# Patient Record
Sex: Male | Born: 1990 | Race: White | Hispanic: No | Marital: Single | Smoking: Former smoker
Health system: Southern US, Community
[De-identification: ages and names within clinical notes are randomized; demographics above are authoritative.]

## PROBLEM LIST (undated history)

## (undated) DIAGNOSIS — E781 Pure hyperglyceridemia: Secondary | ICD-10-CM

## (undated) DIAGNOSIS — K859 Acute pancreatitis without necrosis or infection, unspecified: Secondary | ICD-10-CM

## (undated) DIAGNOSIS — I8500 Esophageal varices without bleeding: Secondary | ICD-10-CM

## (undated) DIAGNOSIS — G4700 Insomnia, unspecified: Secondary | ICD-10-CM

## (undated) DIAGNOSIS — R569 Unspecified convulsions: Secondary | ICD-10-CM

## (undated) DIAGNOSIS — I10 Essential (primary) hypertension: Secondary | ICD-10-CM

## (undated) DIAGNOSIS — F32A Depression, unspecified: Secondary | ICD-10-CM

## (undated) DIAGNOSIS — F419 Anxiety disorder, unspecified: Secondary | ICD-10-CM

## (undated) HISTORY — DX: Essential (primary) hypertension: I10

## (undated) HISTORY — DX: Esophageal varices without bleeding: I85.00

## (undated) HISTORY — DX: Insomnia, unspecified: G47.00

## (undated) HISTORY — DX: Acute pancreatitis without necrosis or infection, unspecified: K85.90

## (undated) HISTORY — DX: Anxiety disorder, unspecified: F41.9

## (undated) HISTORY — DX: Pure hyperglyceridemia: E78.1

## (undated) HISTORY — DX: Depression, unspecified: F32.A

## (undated) HISTORY — DX: Unspecified convulsions: R56.9

---

## 2020-09-29 ENCOUNTER — Inpatient Hospital Stay (HOSPITAL_COMMUNITY)
Admission: AD | Admit: 2020-09-29 | Discharge: 2020-10-14 | DRG: 438 | Disposition: A | Discharge status: Home / Self Care | Payer: Medicaid Other | Source: Other Acute Inpatient Hospital | Attending: Internal Medicine | Admitting: Internal Medicine

## 2020-09-29 DIAGNOSIS — D72829 Elevated white blood cell count, unspecified: Secondary | ICD-10-CM | POA: Diagnosis present

## 2020-09-29 DIAGNOSIS — D696 Thrombocytopenia, unspecified: Secondary | ICD-10-CM | POA: Diagnosis not present

## 2020-09-29 DIAGNOSIS — F1010 Alcohol abuse, uncomplicated: Secondary | ICD-10-CM | POA: Diagnosis present

## 2020-09-29 DIAGNOSIS — E872 Acidosis: Secondary | ICD-10-CM | POA: Diagnosis not present

## 2020-09-29 DIAGNOSIS — D62 Acute posthemorrhagic anemia: Secondary | ICD-10-CM

## 2020-09-29 DIAGNOSIS — N179 Acute kidney failure, unspecified: Secondary | ICD-10-CM | POA: Diagnosis present

## 2020-09-29 DIAGNOSIS — K859 Acute pancreatitis without necrosis or infection, unspecified: Principal | ICD-10-CM | POA: Diagnosis present

## 2020-09-29 DIAGNOSIS — D6489 Other specified anemias: Secondary | ICD-10-CM | POA: Diagnosis not present

## 2020-09-29 DIAGNOSIS — K863 Pseudocyst of pancreas: Secondary | ICD-10-CM | POA: Diagnosis present

## 2020-09-29 DIAGNOSIS — R509 Fever, unspecified: Secondary | ICD-10-CM

## 2020-09-29 DIAGNOSIS — K852 Alcohol induced acute pancreatitis without necrosis or infection: Principal | ICD-10-CM | POA: Diagnosis present

## 2020-09-29 DIAGNOSIS — K267 Chronic duodenal ulcer without hemorrhage or perforation: Secondary | ICD-10-CM | POA: Diagnosis present

## 2020-09-29 DIAGNOSIS — K921 Melena: Secondary | ICD-10-CM | POA: Diagnosis not present

## 2020-09-29 DIAGNOSIS — R935 Abnormal findings on diagnostic imaging of other abdominal regions, including retroperitoneum: Secondary | ICD-10-CM

## 2020-09-29 DIAGNOSIS — E871 Hypo-osmolality and hyponatremia: Secondary | ICD-10-CM | POA: Diagnosis present

## 2020-09-29 DIAGNOSIS — A419 Sepsis, unspecified organism: Secondary | ICD-10-CM | POA: Diagnosis not present

## 2020-09-29 DIAGNOSIS — I8500 Esophageal varices without bleeding: Secondary | ICD-10-CM

## 2020-09-29 DIAGNOSIS — Z20822 Contact with and (suspected) exposure to covid-19: Secondary | ICD-10-CM | POA: Diagnosis present

## 2020-09-29 DIAGNOSIS — K862 Cyst of pancreas: Secondary | ICD-10-CM | POA: Diagnosis present

## 2020-09-29 DIAGNOSIS — K766 Portal hypertension: Secondary | ICD-10-CM | POA: Diagnosis present

## 2020-09-29 DIAGNOSIS — I10 Essential (primary) hypertension: Secondary | ICD-10-CM | POA: Diagnosis not present

## 2020-09-29 DIAGNOSIS — K8500 Idiopathic acute pancreatitis without necrosis or infection: Secondary | ICD-10-CM | POA: Diagnosis not present

## 2020-09-29 DIAGNOSIS — I471 Supraventricular tachycardia: Secondary | ICD-10-CM | POA: Diagnosis not present

## 2020-09-29 DIAGNOSIS — I8510 Secondary esophageal varices without bleeding: Secondary | ICD-10-CM | POA: Diagnosis not present

## 2020-09-29 DIAGNOSIS — J189 Pneumonia, unspecified organism: Secondary | ICD-10-CM | POA: Diagnosis not present

## 2020-09-29 DIAGNOSIS — R0682 Tachypnea, not elsewhere classified: Secondary | ICD-10-CM

## 2020-09-29 DIAGNOSIS — E861 Hypovolemia: Secondary | ICD-10-CM | POA: Diagnosis present

## 2020-09-29 DIAGNOSIS — E781 Pure hyperglyceridemia: Secondary | ICD-10-CM | POA: Diagnosis present

## 2020-09-29 DIAGNOSIS — J918 Pleural effusion in other conditions classified elsewhere: Secondary | ICD-10-CM | POA: Diagnosis not present

## 2020-09-29 DIAGNOSIS — E162 Hypoglycemia, unspecified: Secondary | ICD-10-CM | POA: Diagnosis not present

## 2020-09-29 DIAGNOSIS — K3189 Other diseases of stomach and duodenum: Secondary | ICD-10-CM | POA: Diagnosis present

## 2020-09-29 DIAGNOSIS — D735 Infarction of spleen: Secondary | ICD-10-CM | POA: Diagnosis not present

## 2020-09-29 DIAGNOSIS — R0600 Dyspnea, unspecified: Secondary | ICD-10-CM

## 2020-09-29 DIAGNOSIS — E8809 Other disorders of plasma-protein metabolism, not elsewhere classified: Secondary | ICD-10-CM | POA: Diagnosis not present

## 2020-09-29 DIAGNOSIS — J90 Pleural effusion, not elsewhere classified: Secondary | ICD-10-CM

## 2020-09-29 LAB — GLUCOSE, CAPILLARY: Glucose-Capillary: 149 mg/dL — ABNORMAL HIGH (ref 70–99)

## 2020-09-29 MED ORDER — CHLORHEXIDINE GLUCONATE CLOTH 2 % EX PADS
6.0000 | MEDICATED_PAD | Freq: Every day | CUTANEOUS | Status: DC
  Administered 2020-09-29 – 2020-10-12 (×7): 6 via TOPICAL

## 2020-09-30 ENCOUNTER — Other Ambulatory Visit: Payer: Self-pay

## 2020-09-30 ENCOUNTER — Encounter (HOSPITAL_COMMUNITY): Payer: Self-pay | Admitting: Pulmonary Disease

## 2020-09-30 ENCOUNTER — Inpatient Hospital Stay (HOSPITAL_COMMUNITY): Payer: Medicaid Other

## 2020-09-30 DIAGNOSIS — K852 Alcohol induced acute pancreatitis without necrosis or infection: Principal | ICD-10-CM

## 2020-09-30 DIAGNOSIS — K859 Acute pancreatitis without necrosis or infection, unspecified: Secondary | ICD-10-CM | POA: Diagnosis present

## 2020-09-30 DIAGNOSIS — E781 Pure hyperglyceridemia: Secondary | ICD-10-CM | POA: Diagnosis present

## 2020-09-30 DIAGNOSIS — E872 Acidosis: Secondary | ICD-10-CM

## 2020-09-30 DIAGNOSIS — N179 Acute kidney failure, unspecified: Secondary | ICD-10-CM | POA: Diagnosis present

## 2020-09-30 DIAGNOSIS — D72829 Elevated white blood cell count, unspecified: Secondary | ICD-10-CM | POA: Diagnosis present

## 2020-09-30 LAB — CBC WITH DIFFERENTIAL/PLATELET
Abs Immature Granulocytes: 0.11 10*3/uL — ABNORMAL HIGH (ref 0.00–0.07)
Basophils Absolute: 0.1 10*3/uL (ref 0.0–0.1)
Basophils Relative: 0 %
Eosinophils Absolute: 0 10*3/uL (ref 0.0–0.5)
Eosinophils Relative: 0 %
HCT: 44.8 % (ref 39.0–52.0)
Hemoglobin: 15.7 g/dL (ref 13.0–17.0)
Immature Granulocytes: 1 %
Lymphocytes Relative: 8 %
Lymphs Abs: 1.3 10*3/uL (ref 0.7–4.0)
MCH: 33 pg (ref 26.0–34.0)
MCHC: 35 g/dL (ref 30.0–36.0)
MCV: 94.1 fL (ref 80.0–100.0)
Monocytes Absolute: 0.2 10*3/uL (ref 0.1–1.0)
Monocytes Relative: 1 %
Neutro Abs: 14 10*3/uL — ABNORMAL HIGH (ref 1.7–7.7)
Neutrophils Relative %: 90 %
Platelets: 221 10*3/uL (ref 150–400)
RBC: 4.76 MIL/uL (ref 4.22–5.81)
RDW: 14.6 % (ref 11.5–15.5)
WBC: 15.4 10*3/uL — ABNORMAL HIGH (ref 4.0–10.5)
nRBC: 0 % (ref 0.0–0.2)

## 2020-09-30 LAB — COMPREHENSIVE METABOLIC PANEL
ALT: 20 U/L (ref 0–44)
ALT: 8 U/L (ref 0–44)
AST: 49 U/L — ABNORMAL HIGH (ref 15–41)
AST: 48 U/L — ABNORMAL HIGH (ref 15–41)
Albumin: 2.8 g/dL — ABNORMAL LOW (ref 3.5–5.0)
Albumin: 2.6 g/dL — ABNORMAL LOW (ref 3.5–5.0)
Alkaline Phosphatase: 44 U/L (ref 38–126)
Alkaline Phosphatase: 45 U/L (ref 38–126)
Anion gap: 10 (ref 5–15)
Anion gap: 9 (ref 5–15)
BUN: 26 mg/dL — ABNORMAL HIGH (ref 6–20)
BUN: 26 mg/dL — ABNORMAL HIGH (ref 6–20)
CO2: 18 mmol/L — ABNORMAL LOW (ref 22–32)
CO2: 19 mmol/L — ABNORMAL LOW (ref 22–32)
Calcium: 6.3 mg/dL — CL (ref 8.9–10.3)
Calcium: 6.6 mg/dL — ABNORMAL LOW (ref 8.9–10.3)
Chloride: 101 mmol/L (ref 98–111)
Chloride: 102 mmol/L (ref 98–111)
Creatinine, Ser: 1.39 mg/dL — ABNORMAL HIGH (ref 0.61–1.24)
Creatinine, Ser: 1.37 mg/dL — ABNORMAL HIGH (ref 0.61–1.24)
GFR, Estimated: >60 mL/min (ref >60)
GFR, Estimated: >60 mL/min (ref >60)
Glucose, Bld: 114 mg/dL — ABNORMAL HIGH (ref 70–99)
Glucose, Bld: 110 mg/dL — ABNORMAL HIGH (ref 70–99)
Potassium: 4.8 mmol/L (ref 3.5–5.1)
Potassium: 4.7 mmol/L (ref 3.5–5.1)
Sodium: 129 mmol/L — ABNORMAL LOW (ref 135–145)
Sodium: 130 mmol/L — ABNORMAL LOW (ref 135–145)
Total Bilirubin: 1.5 mg/dL — ABNORMAL HIGH (ref 0.3–1.2)
Total Bilirubin: 1.7 mg/dL — ABNORMAL HIGH (ref 0.3–1.2)
Total Protein: 5.9 g/dL — ABNORMAL LOW (ref 6.5–8.1)
Total Protein: 5.6 g/dL — ABNORMAL LOW (ref 6.5–8.1)

## 2020-09-30 LAB — GLUCOSE, CAPILLARY
Glucose-Capillary: 105 mg/dL — ABNORMAL HIGH (ref 70–99)
Glucose-Capillary: 122 mg/dL — ABNORMAL HIGH (ref 70–99)
Glucose-Capillary: 127 mg/dL — ABNORMAL HIGH (ref 70–99)
Glucose-Capillary: 132 mg/dL — ABNORMAL HIGH (ref 70–99)
Glucose-Capillary: 137 mg/dL — ABNORMAL HIGH (ref 70–99)
Glucose-Capillary: 138 mg/dL — ABNORMAL HIGH (ref 70–99)
Glucose-Capillary: 143 mg/dL — ABNORMAL HIGH (ref 70–99)
Glucose-Capillary: 153 mg/dL — ABNORMAL HIGH (ref 70–99)
Glucose-Capillary: 162 mg/dL — ABNORMAL HIGH (ref 70–99)
Glucose-Capillary: 176 mg/dL — ABNORMAL HIGH (ref 70–99)
Glucose-Capillary: 56 mg/dL — ABNORMAL LOW (ref 70–99)
Glucose-Capillary: 59 mg/dL — ABNORMAL LOW (ref 70–99)
Glucose-Capillary: 75 mg/dL (ref 70–99)
Glucose-Capillary: 84 mg/dL (ref 70–99)
Glucose-Capillary: 98 mg/dL (ref 70–99)

## 2020-09-30 LAB — MRSA PCR SCREENING: MRSA by PCR: NEGATIVE

## 2020-09-30 LAB — POCT I-STAT 7, (LYTES, BLD GAS, ICA,H+H)
Acid-base deficit: 4 mmol/L — ABNORMAL HIGH (ref 0.0–2.0)
Bicarbonate: 19.2 mmol/L — ABNORMAL LOW (ref 20.0–28.0)
Calcium, Ion: 0.91 mmol/L — ABNORMAL LOW (ref 1.15–1.40)
HCT: 46 % (ref 39.0–52.0)
Hemoglobin: 15.6 g/dL (ref 13.0–17.0)
O2 Saturation: 96 %
Patient temperature: 99
Potassium: 4.2 mmol/L (ref 3.5–5.1)
Sodium: 132 mmol/L — ABNORMAL LOW (ref 135–145)
TCO2: 20 mmol/L — ABNORMAL LOW (ref 22–32)
pCO2 arterial: 31.6 mmHg — ABNORMAL LOW (ref 32.0–48.0)
pH, Arterial: 7.394 (ref 7.350–7.450)
pO2, Arterial: 81 mmHg — ABNORMAL LOW (ref 83.0–108.0)

## 2020-09-30 LAB — HIV ANTIBODY (ROUTINE TESTING W REFLEX): HIV Screen 4th Generation wRfx: NONREACTIVE

## 2020-09-30 LAB — CBC
HCT: 42.7 % (ref 39.0–52.0)
Hemoglobin: 14.7 g/dL (ref 13.0–17.0)
MCH: 32.9 pg (ref 26.0–34.0)
MCHC: 34.4 g/dL (ref 30.0–36.0)
MCV: 95.5 fL (ref 80.0–100.0)
Platelets: 194 10*3/uL (ref 150–400)
RBC: 4.47 MIL/uL (ref 4.22–5.81)
RDW: 14.9 % (ref 11.5–15.5)
WBC: 14.6 10*3/uL — ABNORMAL HIGH (ref 4.0–10.5)
nRBC: 0 % (ref 0.0–0.2)

## 2020-09-30 LAB — TRIGLYCERIDES
Triglycerides: 1014 mg/dL — ABNORMAL HIGH (ref <150)
Triglycerides: 760 mg/dL — ABNORMAL HIGH (ref <150)
Triglycerides: 568 mg/dL — ABNORMAL HIGH (ref <150)

## 2020-09-30 LAB — PHOSPHORUS: Phosphorus: 2.6 mg/dL (ref 2.5–4.6)

## 2020-09-30 LAB — PROTIME-INR
INR: 1.2 (ref 0.8–1.2)
Prothrombin Time: 15.6 seconds — ABNORMAL HIGH (ref 11.4–15.2)

## 2020-09-30 LAB — MAGNESIUM: Magnesium: 2.1 mg/dL (ref 1.7–2.4)

## 2020-09-30 LAB — LIPASE, BLOOD: Lipase: 180 U/L — ABNORMAL HIGH (ref 11–51)

## 2020-09-30 MED ORDER — HEPARIN SODIUM (PORCINE) 5000 UNIT/ML IJ SOLN
5000.0000 [IU] | Freq: Three times a day (TID) | INTRAMUSCULAR | Status: DC
  Administered 2020-09-30 – 2020-10-08 (×27): 5000 [IU] via SUBCUTANEOUS
  Filled 2020-09-30 (×25): qty 1

## 2020-09-30 MED ORDER — SODIUM CHLORIDE 0.9 % IV SOLN
8.0000 mg | Freq: Four times a day (QID) | INTRAVENOUS | Status: DC | PRN
  Administered 2020-09-30 (×2): 8 mg via INTRAVENOUS
  Filled 2020-09-30 (×6): qty 4

## 2020-09-30 MED ORDER — POLYETHYLENE GLYCOL 3350 17 G PO PACK: 17.0000 g | PACK | Freq: Every day | ORAL | Status: DC | PRN

## 2020-09-30 MED ORDER — OXYCODONE HCL 5 MG PO TABS
5.0000 mg | ORAL_TABLET | Freq: Four times a day (QID) | ORAL | Status: DC | PRN
  Administered 2020-09-30 – 2020-10-05 (×11): 10 mg via ORAL
  Filled 2020-09-30 (×11): qty 2

## 2020-09-30 MED ORDER — INSULIN (MYXREDLIN) INFUSION FOR HYPERTRIGLYCERIDEMIA
0.0500 [IU]/kg/h | INTRAVENOUS | Status: DC
  Administered 2020-09-30 (×2): 0.1 [IU]/kg/h via INTRAVENOUS
  Filled 2020-09-30 (×2): qty 100

## 2020-09-30 MED ORDER — DEXTROSE-NACL 10-0.45 % IV SOLN
INTRAVENOUS | Status: DC
  Filled 2020-09-30 (×13): qty 1000

## 2020-09-30 MED ORDER — MORPHINE SULFATE (PF) 2 MG/ML IV SOLN
2.0000 mg | INTRAVENOUS | Status: DC | PRN
  Administered 2020-09-30: 4 mg via INTRAVENOUS
  Administered 2020-09-30 (×4): 2 mg via INTRAVENOUS
  Administered 2020-10-01: 4 mg via INTRAVENOUS
  Administered 2020-10-01: 2 mg via INTRAVENOUS
  Filled 2020-09-30 (×4): qty 2
  Filled 2020-09-30: qty 1
  Filled 2020-09-30: qty 2

## 2020-09-30 MED ORDER — DOCUSATE SODIUM 100 MG PO CAPS: 100.0000 mg | ORAL_CAPSULE | Freq: Two times a day (BID) | ORAL | Status: DC | PRN

## 2020-09-30 MED ORDER — LORAZEPAM 2 MG/ML IJ SOLN: 1.0000 mg | INTRAMUSCULAR | Status: DC | PRN

## 2020-09-30 MED ORDER — CALCIUM GLUCONATE-NACL 1-0.675 GM/50ML-% IV SOLN
1.0000 g | Freq: Once | INTRAVENOUS | Status: AC
  Administered 2020-09-30: 1000 mg via INTRAVENOUS
  Filled 2020-09-30: qty 50

## 2020-09-30 MED ORDER — THIAMINE HCL 100 MG PO TABS: 100.0000 mg | ORAL_TABLET | Freq: Every day | ORAL | Status: DC

## 2020-09-30 MED ORDER — INSULIN (MYXREDLIN) INFUSION FOR HYPERTRIGLYCERIDEMIA: 1.0000 [IU]/h | INTRAVENOUS | Status: DC

## 2020-09-30 MED ORDER — THIAMINE HCL 100 MG/ML IJ SOLN
100.0000 mg | Freq: Every day | INTRAMUSCULAR | Status: DC
  Administered 2020-09-30: 100 mg via INTRAVENOUS
  Filled 2020-09-30: qty 2

## 2020-09-30 MED ORDER — LACTATED RINGERS IV SOLN: INTRAVENOUS | Status: DC

## 2020-09-30 MED ORDER — INSULIN (MYXREDLIN) INFUSION FOR HYPERTRIGLYCERIDEMIA
0.2000 [IU]/kg/h | INTRAVENOUS | Status: DC
  Administered 2020-09-30: 0.2 [IU]/kg/h via INTRAVENOUS
  Filled 2020-09-30: qty 100

## 2020-09-30 MED ORDER — SODIUM CHLORIDE 0.9 % IV SOLN: INTRAVENOUS | Status: DC | PRN

## 2020-09-30 MED ORDER — ACETAMINOPHEN 325 MG PO TABS
650.0000 mg | ORAL_TABLET | ORAL | Status: AC | PRN
  Administered 2020-10-03 (×2): 650 mg via ORAL
  Filled 2020-09-30 (×2): qty 2

## 2020-09-30 MED ORDER — FENOFIBRATE 160 MG PO TABS
160.0000 mg | ORAL_TABLET | Freq: Every day | ORAL | Status: DC
  Administered 2020-09-30: 160 mg via ORAL
  Filled 2020-09-30 (×2): qty 1

## 2020-09-30 NOTE — H&P (Signed)
NAME:  Dennis [name], MRN:  [id number], DOB:  17-Jul-1990, LOS: 1 ADMISSION DATE:  09/29/2020, CONSULTATION DATE:  09/29/2020 REFERRING MD:  [facility], CHIEF COMPLAINT:  Pancreatitis, hypertriglyceridemia   History of Present Illness:  30 year old male with PMHx significant for EtOH use, tobacco use and marijuana use with remote seizures (none since several years ago). Presented to [facility] 5/1 with severe mid-epigastric abdominal pain cx/f pancreatitis.  Patient was previously evaluated at [facility] 2 weeks prior with similar symptoms and diagnosed with alcoholic pancreatitis. At that time he was discharged home with conservative management/analgesics and he ceased EtOH use (has not drank since discharge). Patient re-presented after recurrence of epigastric pain. Labs in [place] ED demonstrated WBC 23.5, stable H&H/Plt, Na 131, K 4.8, Bicarb 18, Cr 0.9, Glucose 131. Lipase was noted to be 555 and triglycerides 2423. CT A/P with contrast demonstrated acute pancreatitis with increased inflammatory changes compared to prior CT as well as increased fluid around the body of the pancreas. He was admitted with plan for aggressive fluid resuscitation, but the next day was found to be tachycardic with AKI (Cr 2.3 from baseline 0.9).  Patient was subsequently started on an insulin gtt and transferred to [facility] for management of acute pancreatitis with consideration of PLEX for hypertriglyceridemia.   Pertinent Medical History:  Tobacco use EtOH use Marijuana use  Significant Hospital Events: Including procedures, antibiotic start and stop dates in addition to other pertinent events   . 5/2 - Transferred from [facility] for pancreatitis, hypertriglyceridemia with possible need for PLEX; insulin gtt initiated  Interim History / Subjective:    Objective   Blood pressure (!) 150/105, pulse (!) 122, temperature 99 F (37.2 C), temperature source Oral, resp. rate (!) 25, height 6'  1" (1.854 m), weight 103 kg, SpO2 96 %.       No intake or output data in the 24 hours ending 09/30/20 0037 Filed Weights   09/29/20 2309  Weight: 103 kg   Physical Examination: General: Acutely ill-appearing man in NAD, appears uncomfortable. HEENT: [place]/AT, anicteric sclera, PERRL, moist mucous membranes. Neuro: Awake, oriented x 4. Responds to verbal stimuli. Following commands consistently. Moves all 4 extremities spontaneously. Strength 5/5 in all 4 extremities. CV: Tachycardic, regular rhythm, no m/g/r. PULM: Tachypneic to 30s on RA. Lung fields diminished bilaterally, ?splinting 2/2 pain. GI: Moderately distended, tender to palpation over epigastrum and at lateral aspects of abdomen. Hypoactive bowel sounds. Extremities: Trace BLE edema noted. Skin: Warm/dry, no rashes.  Labs/imaging that I have personally reviewed:  (right click and "Reselect all SmartList Selections" daily)  WBC 23.5, stable H&H/Plt  Na 131, K 4.8, Bicarb 18, Cr 0.9, Glucose 131  Lipase 555, triglycerides 2423  CT A/P with contrast demonstrated acute pancreatitis with increased inflammatory changes compared to prior CT as well as increased fluid around the body of the pancreas.  Resolved Hospital Problem list     Assessment & Plan:  Acute pancreatitis secondary to alcohol use Hypertriglyceridemia Presented to [facility] with severe epigastric abdominal pain. Similar presentation 2 weeks prior to admission. C/f alcoholic pancreatitis. CT A/P with findings consistent with acute pancreatitis. US Gallbladder/RUQ was negative for gallstones/biliary dilatation. Lipase 555, Triglycerides 2423. Transferred from [facility] for consideration of PLEX for triglyceride management in the setting of acute pancreatitis. - Insulin gtt until TG < 500 - NPO status, continue D10 + LR - Fluid resuscitation - Morphine PRN for pain - Trend pancreatic enzymes - Trend triglycerides - May need PLEX if  not improving,  would require central access  Leukocytosis Initial concern at OSH for intraabdominal infection; favor reactive given pancreatitis. Received Invanz x 1 at OSH. BCx NGTD. - Trend CBC, specifically WBC - F/u BCx - Obtain CXR/ABG  Acute kidney injury Hypomagnesemia Hypocalcemia - Trend BMP - Replete electrolytes as indicated - Monitor I&Os - F/u urine studies - Avoid nephrotoxic agents as able  EtOH use Tobacco use Marijuana use History of EtOH, tobacco and marijuana use. Reportedly abstinent from alcohol since pancreatitis diagnosis 2 weeks prior to admission - CIWA protocol - Consider nicotine patch as appropriate - Encourage cessation  Best practice (right click and "Reselect all SmartList Selections" daily)  Diet:  NPO Pain/Anxiety/Delirium protocol (if indicated): No VAP protocol (if indicated): Not indicated DVT prophylaxis: Subcutaneous Heparin GI prophylaxis: N/A Glucose control:  Insulin gtt Central venous access:  N/A Arterial line:  N/A Foley:  N/A Mobility:  OOB  PT consulted: Yes Last date of multidisciplinary goals of care discussion [5/2] Code Status:  full code Disposition: ICU  Labs   CBC: No results for input(s): WBC, NEUTROABS, HGB, HCT, MCV, PLT in the last 168 hours.  Basic Metabolic Panel: No results for input(s): NA, K, CL, CO2, GLUCOSE, BUN, CREATININE, CALCIUM, MG, PHOS in the last 168 hours. GFR: CrCl cannot be calculated (No successful lab value found.). No results for input(s): PROCALCITON, WBC, LATICACIDVEN in the last 168 hours.  Liver Function Tests: No results for input(s): AST, ALT, ALKPHOS, BILITOT, PROT, ALBUMIN in the last 168 hours. No results for input(s): LIPASE, AMYLASE in the last 168 hours. No results for input(s): AMMONIA in the last 168 hours.  ABG No results found for: PHART, PCO2ART, PO2ART, HCO3, TCO2, ACIDBASEDEF, O2SAT   Coagulation Profile: No results for input(s): INR, PROTIME in the last 168 hours.  Cardiac  Enzymes: No results for input(s): CKTOTAL, CKMB, CKMBINDEX, TROPONINI in the last 168 hours.  HbA1C: No results found for: HGBA1C  CBG: Recent Labs  Lab 09/29/20 2305  GLUCAP 149*   Review of Systems:   Review of systems completed with pertinent positives/negatives outlined in above HPI.  Past Medical History:  Tobacco use, EtOH use, marijuana use, ?seizures  Surgical History:  History reviewed. No pertinent surgical history.   Social History:      Family History:  His family history is not on file.   Allergies: Not on File   Home Medications  Prior to Admission medications   Not on File    Critical care time:    Faythe Ghee [facility] Pulmonary & Critical Care 09/30/20 12:37 AM  Please see [email or web address] for pager details.  From 7A-7P if no response, please call [phone] After hours, please call E-Link [phone]

## 2020-09-30 NOTE — Progress Notes (Signed)
eLink Physician-Brief Progress Note Patient Name: Dennis [name] DOB: [birth year] MRN: [id number]   Date of Service  09/30/2020  HPI/Events of Note  Patient needs PO PRN Tylenol order for analgesia.  eICU Interventions  Tylenol ordered.        Thomasene Lot Saulo Anthis 09/30/2020, 8:09 PM

## 2020-09-30 NOTE — Progress Notes (Signed)
NAME:  Dennis [name], MRN:  [id number], DOB:  [birth year], LOS: 1 ADMISSION DATE:  09/29/2020, CONSULTATION DATE:  09/29/2020 REFERRING MD:  [facility], CHIEF COMPLAINT:  Pancreatitis, hypertriglyceridemia   History of Present Illness:  30 year old male with PMHx significant for EtOH use, tobacco use and marijuana use with remote seizures (none since several years ago). Presented to [facility] 5/1 with severe mid-epigastric abdominal pain cx/f pancreatitis.  Patient was previously evaluated at [facility] 2 weeks prior with similar symptoms and diagnosed with alcoholic pancreatitis. At that time he was discharged home with conservative management/analgesics and he ceased EtOH use (has not drank since discharge). Patient re-presented after recurrence of epigastric pain. Labs in [place] ED demonstrated WBC 23.5, stable H&H/Plt, Na 131, K 4.8, Bicarb 18, Cr 0.9, Glucose 131. Lipase was noted to be 555 and triglycerides 2423. CT A/P with contrast demonstrated acute pancreatitis with increased inflammatory changes compared to prior CT as well as increased fluid around the body of the pancreas. He was admitted with plan for aggressive fluid resuscitation, but the next day was found to be tachycardic with AKI (Cr 2.3 from baseline 0.9).  Patient was subsequently started on an insulin gtt and transferred to [facility] for management of acute pancreatitis with consideration of PLEX for hypertriglyceridemia.   Pertinent Medical History:  Tobacco use EtOH use Marijuana use  Significant Hospital Events: Including procedures, antibiotic start and stop dates in addition to other pertinent events   . 5/2 - Transferred from [facility] for pancreatitis, hypertriglyceridemia with possible need for PLEX; insulin gtt initiated . 5/3 - TG down to 1014  Interim History / Subjective:  He has less abdominal pain. Thirsty, but not hungry.  Objective   Blood pressure (!) 144/107, pulse (!) 116,  temperature 97.7 F (36.5 C), temperature source Oral, resp. rate (!) 33, height 6\' 1"  (1.854 m), weight 103 kg, SpO2 94 %.        Intake/Output Summary (Last 24 hours) at 09/30/2020 11/30/2020 Last data filed at 09/30/2020 0600 Gross per 24 hour  Intake 1010.39 ml  Output 500 ml  Net 510.39 ml   Filed Weights   09/29/20 2309 09/30/20 0130  Weight: 103 kg 103 kg   Physical Examination: General: young man sitting up in bed in NAD HEENT: [place]/AT, eyes anicteric Neuro: awake, alert, moving all extremities, answering questions appropriately CV: tachycardic, reg rhythm PULM: CTAB, breathing comfortably GI: distended, soft, minimally tender Extremities: Trace BLE edema noted. Skin: Warm/dry, no rashes.  Labs/imaging that I have personally reviewed:  (right click and "Reselect all SmartList Selections" daily)   Na+ 130 BUN 26 Cr 1.37 AST 48 ALT 8 T. Bili 1.7 WBC 14.6  Resolved Hospital Problem list     Assessment & Plan:  Acute pancreatitis secondary to alcohol use & hypertriglyceridemia Presented to [facility] with severe epigastric abdominal pain. Similar presentation 2 weeks prior to admission. [facility] was negative for gallstones/biliary dilatation. Lipase 555, Triglycerides 2423.  - con't insulin infusion (fixed dose) until TG <500; rechecked this afternoon and again tomorrow morning  - clear liquid diet; monitor closely for nausea and increased pain - con't aggressive volume resuscitation - morphine + oxycodone PRN for pain - serial LFTs - responding appropriately to medical therapy; does not require plex -recommend strict ETOH avoidance long-term  Leukocytosis Initial concern at OSH for intraabdominal infection; favor reactive given pancreatitis. Received Invanz x 1 at OSH. BCx NGTD. -Agree with holding additional antibiotics - Follow blood cultures -Serial CBC to monitor  leukocytosis  Acute kidney injury Hypomagnesemia Hyponatremia Hypocalcemia Non-anion gap metabolic  acidosis -Serial BMPs -Electrolytes repleted -Strict I/os - Renally dose meds and avoid nephrotoxic meds  EtOH use Tobacco use Marijuana use History of EtOH, tobacco and marijuana use. Reportedly abstinent from alcohol since pancreatitis diagnosis 2 weeks prior to admission - CIWA protocol -Can add nicotine replacement therapy if needed - Encourage cessation  Mildly elevated transaminase and hyperbilirubinemia due to acute pancreatitis - Continue to monitor  Best practice (right click and "Reselect all SmartList Selections" daily)  Diet:  Oral- clears Pain/Anxiety/Delirium protocol (if indicated): No VAP protocol (if indicated): Not indicated DVT prophylaxis: Subcutaneous Heparin GI prophylaxis: N/A Glucose control:  Insulin gtt Central venous access:  N/A Arterial line:  N/A Foley:  N/A Mobility:  OOB  PT consulted: Yes Last date of multidisciplinary goals of care discussion [5/2] Code Status:  full code Disposition: ICU  Labs   CBC: Recent Labs  Lab 09/30/20 0113 09/30/20 0144  WBC 15.4*  --   NEUTROABS 14.0*  --   HGB 15.7 15.6  HCT 44.8 46.0  MCV 94.1  --   PLT 221  --     Basic Metabolic Panel: Recent Labs  Lab 09/30/20 0113 09/30/20 0144  NA 129* 132*  K 4.8 4.2  CL 101  --   CO2 18*  --   GLUCOSE 114*  --   BUN 26*  --   CREATININE 1.39*  --   CALCIUM 6.3*  --     This patient is critically ill with multiple organ system failure which requires frequent high complexity decision making, assessment, support, evaluation, and titration of therapies. This was completed through the application of advanced monitoring technologies and extensive interpretation of multiple databases. During this encounter critical care time was devoted to patient care services described in this note for 46 minutes.  Steffanie Dunn, DO 09/30/20 5:36 PM [facility]  For contact information, see Amion. If no response to pager, please call PCCM consult  pager. After hours, 7PM- 7AM, please call Elink.

## 2020-09-30 NOTE — Progress Notes (Signed)
eLink Physician-Brief Progress Note Patient Name: Hyde Sires DOB: 10-02-1990 MRN: [id number]   Date of Service  09/30/2020  HPI/Events of Note  Notified of hypoglycemia and insulin drip turned off  eICU Interventions  Increased D10 to 75 and decreased LR to 175 Repeating triglyceride this morning and assess if insulin may be discontinued     Intervention Category Intermediate Interventions: Other:  Darl Pikes 09/30/2020, 4:31 AM

## 2020-09-30 NOTE — Progress Notes (Signed)
eLink Physician-Brief Progress Note Patient Name: Dennis [name] DOB: 26-Jun-1990 MRN: [id number]   Date of Service  09/30/2020  HPI/Events of Note  3 M presented at [facility] ED with abdominal pain. Working diagnosis pancreatitis. Found to have triglyceride 2423, CO2 14, AG 18, BUN 25, Cr 1.5, Na 131, K 4.8, ALT 71, AST 36.  eICU Interventions  Continued on insulin drip unitl TG <500. Ongoing D10 and LR will continue Morphine prn for pain Repeat labs ordered     Intervention Category Major Interventions: Electrolyte abnormality - evaluation and management;Other: Evaluation Type: New Patient Evaluation  Darl Pikes 09/30/2020, 12:30 AM

## 2020-09-30 NOTE — Progress Notes (Signed)
eLink Physician-Brief Progress Note Patient Name: Dennis [name] DOB: 23-Jan-1991 MRN: [id number]   Date of Service  09/30/2020  HPI/Events of Note  Notified of calcium 6.3, albumin 2.8, corrected calcium 7.3 Ionized calcium 0.91  eICU Interventions  Calcium gluconate 1 g ordered     Intervention Category Major Interventions: Electrolyte abnormality - evaluation and management  Darl Pikes 09/30/2020, 3:39 AM

## 2020-10-01 DIAGNOSIS — F1010 Alcohol abuse, uncomplicated: Secondary | ICD-10-CM

## 2020-10-01 DIAGNOSIS — Z720 Tobacco use: Secondary | ICD-10-CM

## 2020-10-01 LAB — CBC
HCT: 36.1 % — ABNORMAL LOW (ref 39.0–52.0)
Hemoglobin: 12.1 g/dL — ABNORMAL LOW (ref 13.0–17.0)
MCH: 32 pg (ref 26.0–34.0)
MCHC: 33.5 g/dL (ref 30.0–36.0)
MCV: 95.5 fL (ref 80.0–100.0)
Platelets: 146 10*3/uL — ABNORMAL LOW (ref 150–400)
RBC: 3.78 MIL/uL — ABNORMAL LOW (ref 4.22–5.81)
RDW: 14.6 % (ref 11.5–15.5)
WBC: 9.6 10*3/uL (ref 4.0–10.5)
nRBC: 0 % (ref 0.0–0.2)

## 2020-10-01 LAB — HEPATIC FUNCTION PANEL
ALT: 15 U/L (ref 0–44)
AST: 28 U/L (ref 15–41)
Albumin: 2.4 g/dL — ABNORMAL LOW (ref 3.5–5.0)
Alkaline Phosphatase: 48 U/L (ref 38–126)
Bilirubin, Direct: 0.3 mg/dL — ABNORMAL HIGH (ref 0.0–0.2)
Indirect Bilirubin: 1.1 mg/dL — ABNORMAL HIGH (ref 0.3–0.9)
Total Bilirubin: 1.4 mg/dL — ABNORMAL HIGH (ref 0.3–1.2)
Total Protein: 5.4 g/dL — ABNORMAL LOW (ref 6.5–8.1)

## 2020-10-01 LAB — COMPREHENSIVE METABOLIC PANEL
ALT: 14 U/L (ref 0–44)
AST: 29 U/L (ref 15–41)
Albumin: 2.5 g/dL — ABNORMAL LOW (ref 3.5–5.0)
Alkaline Phosphatase: 45 U/L (ref 38–126)
Anion gap: 6 (ref 5–15)
BUN: 16 mg/dL (ref 6–20)
CO2: 23 mmol/L (ref 22–32)
Calcium: 6.7 mg/dL — ABNORMAL LOW (ref 8.9–10.3)
Chloride: 100 mmol/L (ref 98–111)
Creatinine, Ser: 1.01 mg/dL (ref 0.61–1.24)
GFR, Estimated: >60 mL/min (ref >60)
Glucose, Bld: 82 mg/dL (ref 70–99)
Potassium: 3.5 mmol/L (ref 3.5–5.1)
Sodium: 129 mmol/L — ABNORMAL LOW (ref 135–145)
Total Bilirubin: 1.4 mg/dL — ABNORMAL HIGH (ref 0.3–1.2)
Total Protein: 5.5 g/dL — ABNORMAL LOW (ref 6.5–8.1)

## 2020-10-01 LAB — GLUCOSE, CAPILLARY
Glucose-Capillary: 72 mg/dL (ref 70–99)
Glucose-Capillary: 77 mg/dL (ref 70–99)
Glucose-Capillary: 115 mg/dL — ABNORMAL HIGH (ref 70–99)
Glucose-Capillary: 135 mg/dL — ABNORMAL HIGH (ref 70–99)
Glucose-Capillary: 139 mg/dL — ABNORMAL HIGH (ref 70–99)
Glucose-Capillary: 134 mg/dL — ABNORMAL HIGH (ref 70–99)

## 2020-10-01 LAB — TRIGLYCERIDES
Triglycerides: 479 mg/dL — ABNORMAL HIGH (ref <150)
Triglycerides: 420 mg/dL — ABNORMAL HIGH (ref <150)
Triglycerides: 445 mg/dL — ABNORMAL HIGH (ref <150)

## 2020-10-01 MED ORDER — CALCIUM GLUCONATE-NACL 1-0.675 GM/50ML-% IV SOLN
1.0000 g | Freq: Once | INTRAVENOUS | Status: AC
  Administered 2020-10-01: 1000 mg via INTRAVENOUS
  Filled 2020-10-01: qty 50

## 2020-10-01 MED ORDER — THIAMINE HCL 100 MG/ML IJ SOLN
100.0000 mg | Freq: Every day | INTRAMUSCULAR | Status: DC
  Administered 2020-10-01 – 2020-10-06 (×6): 100 mg via INTRAVENOUS
  Filled 2020-10-01 (×6): qty 2

## 2020-10-01 MED ORDER — LABETALOL HCL 5 MG/ML IV SOLN
10.0000 mg | Freq: Four times a day (QID) | INTRAVENOUS | Status: DC | PRN
  Administered 2020-10-01 – 2020-10-02 (×2): 10 mg via INTRAVENOUS
  Filled 2020-10-01 (×2): qty 4

## 2020-10-01 MED ORDER — HYDROMORPHONE HCL 1 MG/ML IJ SOLN
0.5000 mg | INTRAMUSCULAR | Status: DC | PRN
  Administered 2020-10-01 – 2020-10-06 (×19): 0.5 mg via INTRAVENOUS
  Filled 2020-10-01: qty 0.5
  Filled 2020-10-01 (×3): qty 1
  Filled 2020-10-01: qty 0.5
  Filled 2020-10-01 (×3): qty 1
  Filled 2020-10-01: qty 0.5
  Filled 2020-10-01 (×8): qty 1
  Filled 2020-10-01 (×2): qty 0.5
  Filled 2020-10-01: qty 1
  Filled 2020-10-01: qty 0.5

## 2020-10-01 MED ORDER — HYDRALAZINE HCL 20 MG/ML IJ SOLN
10.0000 mg | Freq: Four times a day (QID) | INTRAMUSCULAR | Status: DC | PRN
  Administered 2020-10-01 – 2020-10-07 (×6): 10 mg via INTRAVENOUS
  Filled 2020-10-01 (×6): qty 1

## 2020-10-01 MED ORDER — DEXTROSE-NACL 10-0.45 % IV SOLN
INTRAVENOUS | Status: DC
  Filled 2020-10-01 (×10): qty 1000

## 2020-10-01 MED ORDER — POTASSIUM CHLORIDE CRYS ER 20 MEQ PO TBCR
40.0000 meq | EXTENDED_RELEASE_TABLET | Freq: Once | ORAL | Status: AC
  Administered 2020-10-01: 40 meq via ORAL
  Filled 2020-10-01: qty 2

## 2020-10-01 MED ORDER — ALUM & MAG HYDROXIDE-SIMETH 200-200-20 MG/5ML PO SUSP
15.0000 mL | Freq: Four times a day (QID) | ORAL | Status: AC | PRN
  Administered 2020-10-01 – 2020-10-03 (×3): 15 mL via ORAL
  Filled 2020-10-01 (×3): qty 30

## 2020-10-01 NOTE — Progress Notes (Signed)
K+ 3.5  Replaced per protocol  

## 2020-10-01 NOTE — Progress Notes (Signed)
eLink Physician-Brief Progress Note Patient Name: Dennis [name] DOB: 10-23-1990 MRN: [id number]   Date of Service  10/01/2020  HPI/Events of Note  Patient is on fixed gtt Insulin infusion for hypertriglyceridemia, most recent TG level is 479 and most recent CBG is 72 mg / dl.  eICU Interventions  Will cut the Insulin gtt in half in preparation for daytime PCCM attending deciding if they want to discontinue the Insulin infusion at this point.        Thomasene Lot Denicia Pagliarulo 10/01/2020, 6:48 AM

## 2020-10-01 NOTE — Progress Notes (Signed)
NAME:  Dennis [name], MRN:  [id number], DOB:  1990/10/14, LOS: 2 ADMISSION DATE:  09/29/2020, CONSULTATION DATE:  09/29/2020 REFERRING MD:  [facility], CHIEF COMPLAINT:  Pancreatitis, hypertriglyceridemia   History of Present Illness:  30 year old male with PMHx significant for EtOH use, tobacco use and marijuana use with remote seizures (none since several years ago). Presented to [facility] 5/1 with severe mid-epigastric abdominal pain cx/f pancreatitis.  Patient was previously evaluated at [facility] 2 weeks prior with similar symptoms and diagnosed with alcoholic pancreatitis. At that time he was discharged home with conservative management/analgesics and he ceased EtOH use (has not drank since discharge). Patient re-presented after recurrence of epigastric pain. Labs in [place] ED demonstrated WBC 23.5, stable H&H/Plt, Na 131, K 4.8, Bicarb 18, Cr 0.9, Glucose 131. Lipase was noted to be 555 and triglycerides 2423. CT A/P with contrast demonstrated acute pancreatitis with increased inflammatory changes compared to prior CT as well as increased fluid around the body of the pancreas. He was admitted with plan for aggressive fluid resuscitation, but the next day was found to be tachycardic with AKI (Cr 2.3 from baseline 0.9).  Patient was subsequently started on an insulin gtt and transferred to [facility] for management of acute pancreatitis with consideration of PLEX for hypertriglyceridemia.   Pertinent Medical History:  Tobacco use EtOH use Marijuana use  Significant Hospital Events: Including procedures, antibiotic start and stop dates in addition to other pertinent events   . 5/2 - Transferred from [facility] for pancreatitis, hypertriglyceridemia with possible need for PLEX; insulin gtt initiated . 5/3 - TG down to 1014 . 10/01/2020 triglycerides are under 500  Interim History / Subjective:  Triglycerides are less than 500, D intensify care consider transferring out of  intensive care unit at this time  Objective   Blood pressure (!) 159/101, pulse 93, temperature 99.6 F (37.6 C), temperature source Oral, resp. rate (!) 32, height 6\' 1"  (1.854 m), weight 106.5 kg, SpO2 96 %.        Intake/Output Summary (Last 24 hours) at 10/01/2020 0840 Last data filed at 10/01/2020 12/01/2020 Gross per 24 hour  Intake 5594.8 ml  Output 1350 ml  Net 4244.8 ml   Filed Weights   09/29/20 2309 09/30/20 0130 10/01/20 0500  Weight: 103 kg 103 kg 106.5 kg   Physical Examination: General: Well-developed well-nourished male no acute distress at rest HEENT: No JVD lymphadenopathy is appreciated Neuro: No focal defect is appreciated CV: Heart sounds are regular PULM: Diminished in the base GI: Distended positive bowel sounds slightly tender GU: Voids Extremities: warm/dry, negative edema  Skin: no rashes or lesions   Labs/imaging that I have personally reviewed:  (right click and "Reselect all SmartList Selections" daily)   bmet triglycerides ,lipase  Resolved Hospital Problem list     Assessment & Plan:  Acute pancreatitis secondary to alcohol use & hypertriglyceridemia Presented to [facility] with severe epigastric abdominal pain. Similar presentation 2 weeks prior to admission. [facility] was negative for gallstones/biliary dilatation. Lipase 555, Triglycerides 2423.  10/01/2020 triglycerides have decreased to 479, lipase is 180  10/01/2020 discontinue insulin drip Is less than 500 Continue oral diet Continue to monitor LFTs Avoid EtOH in future     Leukocytosis Initial concern at OSH for intraabdominal infection; favor reactive given pancreatitis. Received Invanz x 1 at OSH. BCx NGTD.  Continue to hold antibiotics Monitor culture data WBC is normalized  Acute kidney injury Hypomagnesemia Hyponatremia Hypocalcemia Non-anion gap metabolic acidosis Recent Labs  Lab  09/30/20 0144 09/30/20 0621 10/01/20 0121  NA 132* 130* 129*   Lab Results  Component  Value Date   CREATININE 1.01 10/01/2020   CREATININE 1.37 (H) 09/30/2020   CREATININE 1.39 (H) 09/30/2020    Monitor replete electrolytes as needed Strict intake and output Creatinine is normalized   EtOH use Tobacco use Marijuana use History of EtOH, tobacco and marijuana use. Reportedly abstinent from alcohol since pancreatitis diagnosis 2 weeks prior to admission  Continue CIWA protocol Nicotine patch as needed Outpatient counseling concerning substance abuse   Mildly elevated transaminase and hyperbilirubinemia due to acute pancreatitis Continue to monitor  Best practice (right click and "Reselect all SmartList Selections" daily)  Diet:  Oral- clears Pain/Anxiety/Delirium protocol (if indicated): No VAP protocol (if indicated): Not indicated DVT prophylaxis: Subcutaneous Heparin GI prophylaxis: N/A Glucose control:  Insulin gtt will be stopped 10/01/20 Central venous access:  N/A Arterial line:  N/A Foley:  N/A Mobility:  OOB  PT consulted: Yes Last date of multidisciplinary goals of care discussion [5/2] Code Status:  full code Disposition: ICU  Labs   CBC: Recent Labs  Lab 09/30/20 0113 09/30/20 0144 09/30/20 0657 10/01/20 0121  WBC 15.4*  --  14.6* 9.6  NEUTROABS 14.0*  --   --   --   HGB 15.7 15.6 14.7 12.1*  HCT 44.8 46.0 42.7 36.1*  MCV 94.1  --  95.5 95.5  PLT 221  --  194 146*    Basic Metabolic Panel: Recent Labs  Lab 09/30/20 0113 09/30/20 0144 09/30/20 0621 10/01/20 0121  NA 129* 132* 130* 129*  K 4.8 4.2 4.7 3.5  CL 101  --  102 100  CO2 18*  --  19* 23  GLUCOSE 114*  --  110* 82  BUN 26*  --  26* 16  CREATININE 1.39*  --  1.37* 1.01  CALCIUM 6.3*  --  6.6* 6.7*  MG  --   --  2.1  --   PHOS  --   --  2.6  --         Brett Canales Guenevere Roorda ACNP Acute Care Nurse Practitioner [facility] Pul[facility]/Critical [facility] Please consult Amion 10/01/2020, 8:41 AM

## 2020-10-01 NOTE — Progress Notes (Signed)
eLink Physician-Brief Progress Note Patient Name: Dennis [name] DOB: 30-Jun-1990 MRN: [id number]   Date of Service  10/01/2020  HPI/Events of Note  Patient asking for something for dyspepsia.  eICU Interventions  Maalox ordered PRN.        Kaelan Emami U Yves Fodor 10/01/2020, 10:00 PM

## 2020-10-02 LAB — BASIC METABOLIC PANEL
Anion gap: 9 (ref 5–15)
BUN: 11 mg/dL (ref 6–20)
CO2: 26 mmol/L (ref 22–32)
Calcium: 7.8 mg/dL — ABNORMAL LOW (ref 8.9–10.3)
Chloride: 96 mmol/L — ABNORMAL LOW (ref 98–111)
Creatinine, Ser: 0.89 mg/dL (ref 0.61–1.24)
GFR, Estimated: >60 mL/min (ref >60)
Glucose, Bld: 146 mg/dL — ABNORMAL HIGH (ref 70–99)
Potassium: 3.8 mmol/L (ref 3.5–5.1)
Sodium: 131 mmol/L — ABNORMAL LOW (ref 135–145)

## 2020-10-02 LAB — GLUCOSE, CAPILLARY
Glucose-Capillary: 152 mg/dL — ABNORMAL HIGH (ref 70–99)
Glucose-Capillary: 133 mg/dL — ABNORMAL HIGH (ref 70–99)
Glucose-Capillary: 120 mg/dL — ABNORMAL HIGH (ref 70–99)
Glucose-Capillary: 133 mg/dL — ABNORMAL HIGH (ref 70–99)

## 2020-10-02 LAB — TRIGLYCERIDES: Triglycerides: 475 mg/dL — ABNORMAL HIGH (ref <150)

## 2020-10-02 MED ORDER — LABETALOL HCL 5 MG/ML IV SOLN
20.0000 mg | Freq: Four times a day (QID) | INTRAVENOUS | Status: DC | PRN
  Administered 2020-10-02 – 2020-10-06 (×5): 20 mg via INTRAVENOUS
  Filled 2020-10-02 (×5): qty 4

## 2020-10-02 MED ORDER — AMLODIPINE BESYLATE 5 MG PO TABS
5.0000 mg | ORAL_TABLET | Freq: Every day | ORAL | Status: DC
  Administered 2020-10-02 – 2020-10-03 (×2): 5 mg via ORAL
  Filled 2020-10-02 (×2): qty 1

## 2020-10-02 NOTE — Progress Notes (Signed)
PROGRESS NOTE    Dennis [name]  IRJ:[id number] DOB: 09/13/1990 DOA: 09/29/2020 PCP: No primary care provider on file.    Brief Narrative:  30 year old male with PMHx significant for EtOH use, tobacco use and marijuana use with remote seizures (none since several years ago). Presented to [facility] 5/1 with severe mid-epigastric abdominal pain cx/f pancreatitis.  Patient was previously evaluated at [facility] 2 weeks prior with similar symptoms and diagnosed with alcoholic pancreatitis. At that time he was discharged home with conservative management/analgesics and he ceased EtOH use (has not drank since discharge). Patient re-presented after recurrence of epigastric pain. Labs in [place] ED demonstrated WBC 23.5, stable H&H/Plt, Na 131, K 4.8, Bicarb 18, Cr 0.9, Glucose 131. Lipase was noted to be 555 and triglycerides 2423. CT A/P with contrast demonstrated acute pancreatitis with increased inflammatory changes compared to prior CT as well as increased fluid around the body of the pancreas. He was admitted with plan for aggressive fluid resuscitation, but the next day was found to be tachycardic with AKI (Cr 2.3 from baseline 0.9).  Patient was subsequently started on an insulin gtt and transferred to [facility] for management of acute pancreatitis with consideration of PLEX for hypertriglyceridemia.   Assessment & Plan:   Principal Problem:   Acute pancreatitis Active Problems:   Hypertriglyceridemia   AKI (acute kidney injury) (HCC)   Leukocytosis  ute pancreatitis secondary to alcohol use & hypertriglyceridemia Presented to [facility] with severe epigastric abdominal pain. Similar presentation 2 weeks prior to admission. [place] was negative for gallstones/biliary dilatation. Lipase 555, Triglycerides 2423.  - con't insulin infusion (fixed dose) until TG <500 - clear liquid diet; monitor closely for nausea and increased pain - con't aggressive volume resuscitation - morphine  + oxycodone PRN for pain - serial LFTs - responding appropriately to medical therapy; does not require plex -recommend strict ETOH avoidance long-term  Leukocytosis Initial concern at OSH for intraabdominal infection; favor reactive given pancreatitis. Received Invanz x 1 at OSH. BCx NGTD. -Agree with holding additional antibiotics - Follow blood cultures -Serial CBC to monitor leukocytosis  Acute kidney injury Hypomagnesemia Hyponatremia Hypocalcemia Non-anion gap metabolic acidosis -Serial BMPs -Electrolytes repleted -Strict I/os - Renally dose meds and avoid nephrotoxic meds  EtOH use Tobacco use Marijuana use History of EtOH, tobacco and marijuana use. Reportedly abstinent from alcohol since pancreatitis diagnosis 2 weeks prior to admission - CIWA protocol -Can add nicotine replacement therapy if needed - Encourage cessation  Mildly elevated transaminase and hyperbilirubinemia due to acute pancreatitis - Continue to monitor   DVT prophylaxis: Heparin SQ Code Status: Full code  Family Communication: Patient at bedside Disposition Plan: Home   Consultants:   Pulmonary critical care medicine  Procedures:  Insulin drip  Antimicrobials: Anti-infectives (From admission, onward)   None       Subjective: Pain is moderate no significant nausea tolerating clear liquids triglycerides down to 475 today and have been less than 500 for 24 hours.  Objective: Vitals:   10/02/20 1100 10/02/20 1200 10/02/20 1300 10/02/20 1322  BP: (!) 156/93 (!) 164/86 (!) 188/104 (!) 186/100  Pulse: (!) 105 97 (!) 106 (!) 102  Resp: (!) 35 (!) 31 (!) 22 20  Temp: 98.9 F (37.2 C)     TempSrc: Oral   Oral  SpO2: 95% 95% 97% 98%  Weight:      Height:        Intake/Output Summary (Last 24 hours) at 10/02/2020 1357 Last data filed at 10/02/2020 1205 Gross  per 24 hour  Intake 1990.88 ml  Output 3300 ml  Net -1309.12 ml   Filed Weights   09/29/20 2309 09/30/20 0130 10/01/20  0500  Weight: 103 kg 103 kg 106.5 kg    Examination:  General exam: Appears calm and comfortable  Respiratory system: Clear to auscultation. Respiratory effort normal. Cardiovascular system: S1 & S2 heard, RRR.  Gastrointestinal system: Abdomen is distended, positive bowel sounds on the right quiet on the left, moderate epigastric tenderness.  Central nervous system: Alert and oriented. No focal neurological deficits. Extremities: Symmetric  Skin: No rashes Psychiatry: Judgement and insight appear normal. Mood & affect appropriate.     Data Reviewed: I have personally reviewed following labs and imaging studies  CBC: Recent Labs  Lab 09/30/20 0113 09/30/20 0144 09/30/20 0657 10/01/20 0121  WBC 15.4*  --  14.6* 9.6  NEUTROABS 14.0*  --   --   --   HGB 15.7 15.6 14.7 12.1*  HCT 44.8 46.0 42.7 36.1*  MCV 94.1  --  95.5 95.5  PLT 221  --  194 146*   Basic Metabolic Panel: Recent Labs  Lab 09/30/20 0113 09/30/20 0144 09/30/20 0621 10/01/20 0121 10/02/20 0223  NA 129* 132* 130* 129* 131*  K 4.8 4.2 4.7 3.5 3.8  CL 101  --  102 100 96*  CO2 18*  --  19* 23 26  GLUCOSE 114*  --  110* 82 146*  BUN 26*  --  26* 16 11  CREATININE 1.39*  --  1.37* 1.01 0.89  CALCIUM 6.3*  --  6.6* 6.7* 7.8*  MG  --   --  2.1  --   --   PHOS  --   --  2.6  --   --    GFR: Estimated Creatinine Clearance: 156.8 mL/min (by C-G formula based on SCr of 0.89 mg/dL). Liver Function Tests: Recent Labs  Lab 09/30/20 0113 09/30/20 0621 10/01/20 0121 10/01/20 0754  AST 49* 48* 29 28  ALT 20 8 14 15   ALKPHOS 44 45 45 48  BILITOT 1.5* 1.7* 1.4* 1.4*  PROT 5.9* 5.6* 5.5* 5.4*  ALBUMIN 2.8* 2.6* 2.5* 2.4*   Recent Labs  Lab 09/30/20 0113  LIPASE 180*   Coagulation Profile: Recent Labs  Lab 09/30/20 0113  INR 1.2   CBG: Recent Labs  Lab 10/01/20 1930 10/01/20 2314 10/02/20 0337 10/02/20 0722 10/02/20 1105  GLUCAP 139* 134* 152* 133* 120*   Lipid Profile: Recent Labs     10/01/20 1450 10/02/20 0223  TRIG 445* 475*     Recent Results (from the past 240 hour(s))  MRSA PCR Screening     Status: None   Collection Time: 09/29/20 11:07 PM   Specimen: Nasal Mucosa; Nasopharyngeal  Result Value Ref Range Status   MRSA by PCR NEGATIVE NEGATIVE Final    Comment:        The GeneXpert MRSA Assay (FDA approved for NASAL specimens only), is one component of a comprehensive MRSA colonization surveillance program. It is not intended to diagnose MRSA infection nor to guide or monitor treatment for MRSA infections. Performed at [facility], [address], [place], [place]       Radiology Studies: No results found.   Scheduled Meds: . amLODipine  5 mg Oral Daily  . Chlorhexidine Gluconate Cloth  6 each Topical Q0600  . heparin  5,000 Units Subcutaneous Q8H  . thiamine injection  100 mg Intravenous Daily   Continuous Infusions: .  sodium chloride Stopped (10/01/20 1059)  . dextrose 10 % and 0.45 % NaCl 75 mL/hr at 10/02/20 1335  . ondansetron (ZOFRAN) IV Stopped (09/30/20 2014)     LOS: 3 days    Reva Bores, MD 10/02/2020 1:57 PM [phone] [facility] If 7PM-7AM, please contact night-coverage 10/02/2020, 1:57 PM

## 2020-10-03 LAB — CBC
HCT: 35.1 % — ABNORMAL LOW (ref 39.0–52.0)
Hemoglobin: 11.8 g/dL — ABNORMAL LOW (ref 13.0–17.0)
MCH: 32.5 pg (ref 26.0–34.0)
MCHC: 33.6 g/dL (ref 30.0–36.0)
MCV: 96.7 fL (ref 80.0–100.0)
Platelets: 143 10*3/uL — ABNORMAL LOW (ref 150–400)
RBC: 3.63 MIL/uL — ABNORMAL LOW (ref 4.22–5.81)
RDW: 14.8 % (ref 11.5–15.5)
WBC: 10.4 10*3/uL (ref 4.0–10.5)
nRBC: 0 % (ref 0.0–0.2)

## 2020-10-03 LAB — COMPREHENSIVE METABOLIC PANEL
ALT: 41 U/L (ref 0–44)
AST: 28 U/L (ref 15–41)
Albumin: 2.4 g/dL — ABNORMAL LOW (ref 3.5–5.0)
Alkaline Phosphatase: 106 U/L (ref 38–126)
Anion gap: 8 (ref 5–15)
BUN: 12 mg/dL (ref 6–20)
CO2: 27 mmol/L (ref 22–32)
Calcium: 8.4 mg/dL — ABNORMAL LOW (ref 8.9–10.3)
Chloride: 96 mmol/L — ABNORMAL LOW (ref 98–111)
Creatinine, Ser: 0.91 mg/dL (ref 0.61–1.24)
GFR, Estimated: >60 mL/min (ref >60)
Glucose, Bld: 125 mg/dL — ABNORMAL HIGH (ref 70–99)
Potassium: 4.3 mmol/L (ref 3.5–5.1)
Sodium: 131 mmol/L — ABNORMAL LOW (ref 135–145)
Total Bilirubin: 2.1 mg/dL — ABNORMAL HIGH (ref 0.3–1.2)
Total Protein: 6 g/dL — ABNORMAL LOW (ref 6.5–8.1)

## 2020-10-03 LAB — TRIGLYCERIDES: Triglycerides: 294 mg/dL — ABNORMAL HIGH (ref <150)

## 2020-10-03 LAB — MAGNESIUM: Magnesium: 2 mg/dL (ref 1.7–2.4)

## 2020-10-03 MED ORDER — AMLODIPINE BESYLATE 10 MG PO TABS
10.0000 mg | ORAL_TABLET | Freq: Every day | ORAL | Status: DC
  Administered 2020-10-04 – 2020-10-13 (×10): 10 mg via ORAL
  Filled 2020-10-03 (×10): qty 1

## 2020-10-03 NOTE — Progress Notes (Signed)
Patient ID: Dennis [name], male   DOB: 01-29-1991, 30 y.o.   MRN: [id number]  PROGRESS NOTE    Dennis [name]  BMW:[id number] DOB: 11-28-1990 DOA: 09/29/2020 PCP: No primary care provider on file.    Brief Narrative:  30 year old male with PMHx significant for EtOH use, tobacco use and marijuana use with remote seizures (none since several years ago). Presented to [facility] 5/1 with severe mid-epigastric abdominal pain cx/f pancreatitis.  Patient was previously evaluated at [facility] 2 weeks prior with similar symptoms and diagnosed with alcoholic pancreatitis. At that time he was discharged home with conservative management/analgesics and he ceased EtOH use (has not drank since discharge). Patient re-presented after recurrence of epigastric pain. Labs in [place] ED demonstrated WBC 23.5, stable H&H/Plt, Na 131, K 4.8, Bicarb 18, Cr 0.9, Glucose 131. Lipase was noted to be 555 and triglycerides 2423. CT A/P with contrast demonstrated acute pancreatitis with increased inflammatory changes compared to prior CT as well as increased fluid around the body of the pancreas. He was admitted with plan for aggressive fluid resuscitation, but the next day was found to be tachycardic with AKI (Cr 2.3 from baseline 0.9).  Patient was subsequently started on an insulin gtt and transferred to [facility] for management of acute pancreatitis with consideration of PLEX for hypertriglyceridemia.   Assessment & Plan:   Principal Problem:   Acute pancreatitis Active Problems:   Hypertriglyceridemia   AKI (acute kidney injury) (HCC)   Leukocytosis  Acute pancreatitis secondary to alcohol use& hypertriglyceridemia Presented to [facility] with severe epigastric abdominal pain. Similar presentation 2 weeks prior to admission. [place] was negative for gallstones/biliary dilatation. Lipase 555, Triglycerides 2423.  -con't insulin infusion(fixed dose)until TG <500--now off x 36 hours, Trig 294  this am. -clear liquid diet; monitor closely for nausea and increased pain -con't aggressive volume resuscitation -morphine + oxycodone PRN for pain -serial LFTs -responding appropriately to medical therapy; does not require plex -recommend strict ETOH avoidance long-term  Leukocytosis Initial concern at OSH for intraabdominal infection; favor reactive given pancreatitis. Received Invanz x 1 at OSH. BCx NGTD. -Agree with holding additional antibiotics -Follow blood cultures -Serial CBC to monitor leukocytosis  Hypertension Added Amlodipine--increased to 10 mg today  Acute kidney injury Hypomagnesemia Hyponatremia Hypocalcemia Non-anion gap metabolic acidosis -Serial BMPs -Electrolytes repleted -Strict I/os -Renally dose meds and avoid nephrotoxic meds  EtOH use Tobacco use Marijuana use History of EtOH, tobacco and marijuana use. Reportedly abstinent from alcohol since pancreatitis diagnosis 2 weeks prior to admission - CIWA protocol -Can add nicotine replacement therapy if needed - Encourage cessation  Mildly elevated transaminase and hyperbilirubinemia due to acute pancreatitis -Continue to monitor   DVT prophylaxis: Heparin SQ Code Status: Full code  Family Communication: Pt at bedside Disposition Plan: home likely tomorrow or next day   Consultants:   PCCM  Procedures:  Insulin gtt  Antimicrobials: Anti-infectives (From admission, onward)   None       Subjective: Feeling better, abdomen is distended. Passing flatus  Objective: Vitals:   10/03/20 0349 10/03/20 0412 10/03/20 0700 10/03/20 0922  BP: (!) 152/94 (!) 143/90 (!) 177/105 (!) 165/104  Pulse: (!) 106 99  (!) 106  Resp: (!) 21 (!) 23    Temp: 99 F (37.2 C)  97.9 F (36.6 C)   TempSrc: Oral  Oral   SpO2: 95% 96% 96%   Weight:      Height:        Intake/Output Summary (Last 24 hours) at 10/03/2020  1102 Last data filed at 10/03/2020 0648 Gross per 24 hour  Intake 148.45  ml  Output 1800 ml  Net -1651.55 ml   Filed Weights   09/30/20 0130 10/01/20 0500 10/03/20 0345  Weight: 103 kg 106.5 kg 104.1 kg    Examination:  General exam: Appears calm and comfortable  Respiratory system: Clear to auscultation. Respiratory effort normal. Cardiovascular system: S1 & S2 heard, RRR.  Gastrointestinal system: Abdomen is distended, + bowel sounds, soft and mildly tender.  Central nervous system: Alert and oriented. No focal neurological deficits. Extremities: Symmetric  Skin: No rashes Psychiatry: Judgement and insight appear normal. Mood & affect appropriate.     Data Reviewed: I have personally reviewed following labs and imaging studies  CBC: Recent Labs  Lab 09/30/20 0113 09/30/20 0144 09/30/20 0657 10/01/20 0121 10/03/20 0935  WBC 15.4*  --  14.6* 9.6 10.4  NEUTROABS 14.0*  --   --   --   --   HGB 15.7 15.6 14.7 12.1* 11.8*  HCT 44.8 46.0 42.7 36.1* 35.1*  MCV 94.1  --  95.5 95.5 96.7  PLT 221  --  194 146* 143*   Basic Metabolic Panel: Recent Labs  Lab 09/30/20 0113 09/30/20 0144 09/30/20 0621 10/01/20 0121 10/02/20 0223 10/03/20 0935  NA 129* 132* 130* 129* 131* 131*  K 4.8 4.2 4.7 3.5 3.8 4.3  CL 101  --  102 100 96* 96*  CO2 18*  --  19* 23 26 27   GLUCOSE 114*  --  110* 82 146* 125*  BUN 26*  --  26* 16 11 12   CREATININE 1.39*  --  1.37* 1.01 0.89 0.91  CALCIUM 6.3*  --  6.6* 6.7* 7.8* 8.4*  MG  --   --  2.1  --   --  2.0  PHOS  --   --  2.6  --   --   --    GFR: Estimated Creatinine Clearance: 151.8 mL/min (by C-G formula based on SCr of 0.91 mg/dL). Liver Function Tests: Recent Labs  Lab 09/30/20 0113 09/30/20 0621 10/01/20 0121 10/01/20 0754 10/03/20 0935  AST 49* 48* 29 28 28   ALT 20 8 14 15  41  ALKPHOS 44 45 45 48 106  BILITOT 1.5* 1.7* 1.4* 1.4* 2.1*  PROT 5.9* 5.6* 5.5* 5.4* 6.0*  ALBUMIN 2.8* 2.6* 2.5* 2.4* 2.4*   Recent Labs  Lab 09/30/20 0113  LIPASE 180*   Coagulation Profile: Recent Labs  Lab  09/30/20 0113  INR 1.2   CBG: Recent Labs  Lab 10/01/20 2314 10/02/20 0337 10/02/20 0722 10/02/20 1105 10/02/20 2112  GLUCAP 134* 152* 133* 120* 133*   Lipid Profile: Recent Labs    10/02/20 0223 10/03/20 0935  TRIG 475* 294*   Sepsis Labs:   Recent Results (from the past 240 hour(s))  MRSA PCR Screening     Status: None   Collection Time: 09/29/20 11:07 PM   Specimen: Nasal Mucosa; Nasopharyngeal  Result Value Ref Range Status   MRSA by PCR NEGATIVE NEGATIVE Final    Comment:        The GeneXpert MRSA Assay (FDA approved for NASAL specimens only), is one component of a comprehensive MRSA colonization surveillance program. It is not intended to diagnose MRSA infection nor to guide or monitor treatment for MRSA infections. Performed at [facility], [address] [place], 11/29/20 [facility]       Radiology Studies: No results found.   Scheduled Meds: . amLODipine  5 mg Oral Daily  . Chlorhexidine Gluconate Cloth  6 each Topical Q0600  . heparin  5,000 Units Subcutaneous Q8H  . thiamine injection  100 mg Intravenous Daily   Continuous Infusions: . sodium chloride Stopped (10/01/20 1059)  . dextrose 10 % and 0.45 % NaCl 75 mL/hr at 10/03/20 0245  . ondansetron (ZOFRAN) IV Stopped (09/30/20 2014)     LOS: 4 days    Reva Bores, MD 10/03/2020 11:02 AM [phone] [facility] If 7PM-7AM, please contact night-coverage 10/03/2020, 11:02 AM

## 2020-10-03 NOTE — Progress Notes (Addendum)
1145 per tele. Pt HR sustaining 120-130's. Have page MD. Awaiting response. BP 165/104, pt asymptomatic.

## 2020-10-04 LAB — GLUCOSE, CAPILLARY
Glucose-Capillary: 104 mg/dL — ABNORMAL HIGH (ref 70–99)
Glucose-Capillary: 133 mg/dL — ABNORMAL HIGH (ref 70–99)

## 2020-10-04 MED ORDER — SODIUM CHLORIDE 0.9 % IV SOLN: INTRAVENOUS | Status: DC

## 2020-10-04 MED ORDER — ACETAMINOPHEN 325 MG PO TABS
650.0000 mg | ORAL_TABLET | Freq: Once | ORAL | Status: AC
  Administered 2020-10-04: 650 mg via ORAL
  Filled 2020-10-04: qty 2

## 2020-10-04 NOTE — TOC Initial Note (Signed)
Transition of Care [facility]) - Initial/Assessment Note    Patient Details  Name: Dennis [name] MRN: [id number] Date of Birth: [birth year]  [facility] CM/SW Contact:    Oretha Milch, LCSW Phone Number: 10/04/2020, 11:12 AM  Clinical Narrative:       CSW acknowledges consult for substance use intervention. CSW met with patient and introduced self and role to patient. CSW inquired into patient's history of substance use and patient patient reported he was drinking several four lokos several times a week and ceased four weeks ago. Patient reports he went to [facility] and after that returned home. Patient reports when he returned home he was able to continue ceasing alcohol but began to feel pain in his lower back resulting in his current hospitalization. Patient reports he feels confident with seeking alcohol and verbalized understanding of the community resources to follow-up with.            Barriers to Discharge: Continued Medical Work up   Patient Goals and CMS Choice        Expected Discharge Plan and Services                                                Prior Living Arrangements/Services                       Activities of Daily Living Home Assistive Devices/Equipment: None ADL Screening (condition at time of admission) Patient's cognitive ability adequate to safely complete daily activities?: Yes Is the patient deaf or have difficulty hearing?: Yes Does the patient have difficulty seeing, even when wearing glasses/contacts?: No Does the patient have difficulty concentrating, remembering, or making decisions?: No Patient able to express need for assistance with ADLs?: No Does the patient have difficulty dressing or bathing?: No Independently performs ADLs?: Yes (appropriate for developmental age) Does the patient have difficulty walking or climbing stairs?: No Weakness of Legs: None Weakness of Arms/Hands:  None  Permission Sought/Granted                  Emotional Assessment              Admission diagnosis:  Pancreatitis, alcoholic, acute [O06.00] Patient Active Problem List   Diagnosis Date Noted  . Acute pancreatitis 09/30/2020  . Hypertriglyceridemia 09/30/2020  . AKI (acute kidney injury) ([place]) 09/30/2020  . Leukocytosis 09/30/2020   PCP:  No primary care provider on file. Pharmacy:   [facility] [address], [place] [address] [place] [address] Phone: [phone] Fax: [phone]     Social Determinants of Health (SDOH) Interventions    Readmission Risk Interventions No flowsheet data found.

## 2020-10-04 NOTE — Progress Notes (Signed)
PROGRESS NOTE  Dennis [name] [id number]:[id number] DOB: Apr 18, 1991 DOA: 09/29/2020 PCP: No primary care provider on file.   LOS: 5 days   Brief Narrative / Interim history: 30 year old male with history of EtOH, tobacco and marijuana use, remote seizures came into the hospital with abdominal pain, diagnosed with acute pancreatitis.  2 weeks ago he was admitted to [facility] ED, diagnosed with alcoholic pancreatitis and discharged home.  He has not had any alcohol since.  His abdominal pain recurred and came back to the hospital, he was found to have triglycerides of 2400 and was sent to [facility], ICU to be evaluated for Plex for hypertriglyceridemia.  Subjective / 24h Interval events: Overall he is feeling better.  Some nausea is persistent, complains of ongoing epigastric abdominal pain going into his back.  Assessment & Plan: Principal Problem Acute pancreatitis, secondary to EtOH and hypertriglyceridemia -initially admitted to the ICU, received insulin and dextrose infusion, triglycerides now less than 500 for the past 36 hours, off infusion with dextrose/insulin -Continue supportive management, pain control -Continue IV fluids, remains on clear liquid diet not ready to be advanced  Active Problems Leukocytosis, fever, tachycardia-in the setting of acute pancreatitis.  May need another CT scan of the abdomen and pelvis if persistent  Essential hypertension-continue amlodipine  Acute kidney injury, hypomagnesemia, hyponatremia, hypocalcemia, non-anion gap metabolic acidosis-in the setting of fluid losses, aggressive hydration  Polysubstance use-determined to quit  Scheduled Meds: . amLODipine  10 mg Oral Daily  . Chlorhexidine Gluconate Cloth  6 each Topical Q0600  . heparin  5,000 Units Subcutaneous Q8H  . thiamine injection  100 mg Intravenous Daily   Continuous Infusions: . sodium chloride Stopped (10/01/20 1059)  . sodium chloride    . ondansetron [facility]) IV Stopped  (09/30/20 2014)   PRN Meds:.sodium chloride, docusate sodium, hydrALAZINE, HYDROmorphone (DILAUDID) injection, labetalol, LORazepam, ondansetron (ZOFRAN) IV, oxyCODONE, polyethylene glycol  Diet Orders (From admission, onward)    Start     Ordered   10/02/20 1124  Diet clear liquid Room service appropriate? Yes; Fluid consistency: Thin  Diet effective now       Question Answer Comment  Room service appropriate? Yes   Fluid consistency: Thin      10/02/20 1124          DVT prophylaxis: heparin injection 5,000 Units Start: 09/30/20 0600 SCDs Start: 09/30/20 0121     Code Status: Full Code  Family Communication: no family present  Status is: Inpatient  Remains inpatient appropriate because:Inpatient level of care appropriate due to severity of illness   Dispo: The patient is from: Home              Anticipated d/c is to: Home              Patient currently is not medically stable to d/c.   Difficult to place patient No  Level of care: Progressive  Consultants:  PCCM  Procedures:  none  Microbiology  none  Antimicrobials: none    Objective: Vitals:   10/04/20 0133 10/04/20 0240 10/04/20 0242 10/04/20 0801  BP: (!) 159/100 (!) 152/93 (!) 152/93 (!) 160/98  Pulse: (!) 124 (!) 116  (!) 120  Resp: (!) 24 (!) 23  (!) 27  Temp: 99.3 F (37.4 C) 98.5 F (36.9 C)  99 F (37.2 C)  TempSrc: Oral Oral  Oral  SpO2:  94%  94%  Weight:      Height:        Intake/Output Summary (Last  24 hours) at 10/04/2020 1125 Last data filed at 10/04/2020 0600 Gross per 24 hour  Intake 360 ml  Output 550 ml  Net -190 ml   Filed Weights   09/30/20 0130 10/01/20 0500 10/03/20 0345  Weight: 103 kg 106.5 kg 104.1 kg    Examination:  Constitutional: NAD Eyes: no scleral icterus ENMT: Mucous membranes are moist.  Neck: normal, supple Respiratory: clear to auscultation bilaterally, no wheezing, no crackles. Normal respiratory effort.  Cardiovascular: Regular rate and rhythm,  no murmurs / rubs / gallops. No LE edema.  Abdomen: Slight distention noted, minimal tenderness palpation of the epigastric area.  No guarding or rebound Musculoskeletal: no clubbing / cyanosis.  Skin: no rashes Neurologic: CN 2-12 grossly intact. Strength 5/5 in all 4.   Data Reviewed: I have independently reviewed following labs and imaging studies   CBC: Recent Labs  Lab 09/30/20 0113 09/30/20 0144 09/30/20 0657 10/01/20 0121 10/03/20 0935  WBC 15.4*  --  14.6* 9.6 10.4  NEUTROABS 14.0*  --   --   --   --   HGB 15.7 15.6 14.7 12.1* 11.8*  HCT 44.8 46.0 42.7 36.1* 35.1*  MCV 94.1  --  95.5 95.5 96.7  PLT 221  --  194 146* 143*   Basic Metabolic Panel: Recent Labs  Lab 09/30/20 0113 09/30/20 0144 09/30/20 0621 10/01/20 0121 10/02/20 0223 10/03/20 0935  NA 129* 132* 130* 129* 131* 131*  K 4.8 4.2 4.7 3.5 3.8 4.3  CL 101  --  102 100 96* 96*  CO2 18*  --  19* 23 26 27   GLUCOSE 114*  --  110* 82 146* 125*  BUN 26*  --  26* 16 11 12   CREATININE 1.39*  --  1.37* 1.01 0.89 0.91  CALCIUM 6.3*  --  6.6* 6.7* 7.8* 8.4*  MG  --   --  2.1  --   --  2.0  PHOS  --   --  2.6  --   --   --    Liver Function Tests: Recent Labs  Lab 09/30/20 0113 09/30/20 0621 10/01/20 0121 10/01/20 0754 10/03/20 0935  AST 49* 48* 29 28 28   ALT 20 8 14 15  41  ALKPHOS 44 45 45 48 106  BILITOT 1.5* 1.7* 1.4* 1.4* 2.1*  PROT 5.9* 5.6* 5.5* 5.4* 6.0*  ALBUMIN 2.8* 2.6* 2.5* 2.4* 2.4*   Coagulation Profile: Recent Labs  Lab 09/30/20 0113  INR 1.2   HbA1C: No results for input(s): HGBA1C in the last 72 hours. CBG: Recent Labs  Lab 10/01/20 2314 10/02/20 0337 10/02/20 0722 10/02/20 1105 10/02/20 2112  GLUCAP 134* 152* 133* 120* 133*    Recent Results (from the past 240 hour(s))  MRSA PCR Screening     Status: None   Collection Time: 09/29/20 11:07 PM   Specimen: Nasal Mucosa; Nasopharyngeal  Result Value Ref Range Status   MRSA by PCR NEGATIVE NEGATIVE Final    Comment:         The GeneXpert MRSA Assay (FDA approved for NASAL specimens only), is one component of a comprehensive MRSA colonization surveillance program. It is not intended to diagnose MRSA infection nor to guide or monitor treatment for MRSA infections. Performed at [facility], [address], [place], 11/29/20 [facility]      Radiology Studies: No results found.   [address], MD, PhD [facility]  Between 7 am - 7 pm I am available, please contact me via Amion (for  emergencies) or Securechat (non urgent messages)  Between 7 pm - 7 am I am not available, please contact night coverage MD/APP via Amion

## 2020-10-05 LAB — GLUCOSE, CAPILLARY
Glucose-Capillary: 99 mg/dL (ref 70–99)
Glucose-Capillary: 115 mg/dL — ABNORMAL HIGH (ref 70–99)
Glucose-Capillary: 124 mg/dL — ABNORMAL HIGH (ref 70–99)
Glucose-Capillary: 107 mg/dL — ABNORMAL HIGH (ref 70–99)

## 2020-10-05 LAB — COMPREHENSIVE METABOLIC PANEL
ALT: 30 U/L (ref 0–44)
AST: 19 U/L (ref 15–41)
Albumin: 2.2 g/dL — ABNORMAL LOW (ref 3.5–5.0)
Alkaline Phosphatase: 102 U/L (ref 38–126)
Anion gap: 8 (ref 5–15)
BUN: 14 mg/dL (ref 6–20)
CO2: 23 mmol/L (ref 22–32)
Calcium: 8.2 mg/dL — ABNORMAL LOW (ref 8.9–10.3)
Chloride: 100 mmol/L (ref 98–111)
Creatinine, Ser: 0.93 mg/dL (ref 0.61–1.24)
GFR, Estimated: >60 mL/min (ref >60)
Glucose, Bld: 95 mg/dL (ref 70–99)
Potassium: 3.9 mmol/L (ref 3.5–5.1)
Sodium: 131 mmol/L — ABNORMAL LOW (ref 135–145)
Total Bilirubin: 1.3 mg/dL — ABNORMAL HIGH (ref 0.3–1.2)
Total Protein: 5.6 g/dL — ABNORMAL LOW (ref 6.5–8.1)

## 2020-10-05 LAB — CBC
HCT: 32.6 % — ABNORMAL LOW (ref 39.0–52.0)
Hemoglobin: 11.1 g/dL — ABNORMAL LOW (ref 13.0–17.0)
MCH: 32.7 pg (ref 26.0–34.0)
MCHC: 34 g/dL (ref 30.0–36.0)
MCV: 96.2 fL (ref 80.0–100.0)
Platelets: 162 10*3/uL (ref 150–400)
RBC: 3.39 MIL/uL — ABNORMAL LOW (ref 4.22–5.81)
RDW: 14.6 % (ref 11.5–15.5)
WBC: 13.8 10*3/uL — ABNORMAL HIGH (ref 4.0–10.5)
nRBC: 0 % (ref 0.0–0.2)

## 2020-10-05 LAB — LIPASE, BLOOD: Lipase: 43 U/L (ref 11–51)

## 2020-10-05 LAB — MAGNESIUM: Magnesium: 1.9 mg/dL (ref 1.7–2.4)

## 2020-10-05 LAB — PHOSPHORUS: Phosphorus: 3.9 mg/dL (ref 2.5–4.6)

## 2020-10-05 NOTE — Progress Notes (Signed)
PROGRESS NOTE  Dennis [name] VOZ:[id number] DOB: Mar 13, 1991 DOA: 09/29/2020 PCP: No primary care provider on file.   LOS: 6 days   Brief Narrative / Interim history: 30 year old male with history of EtOH, tobacco and marijuana use, remote seizures came into the hospital with abdominal pain, diagnosed with acute pancreatitis.  2 weeks ago he was admitted to [facility] ED, diagnosed with alcoholic pancreatitis and discharged home.  He has not had any alcohol since.  His abdominal pain recurred and came back to the hospital, he was found to have triglycerides of 2400 and was sent to [facility], ICU to be evaluated for Plex for hypertriglyceridemia.  Subjective / 24h Interval events: Appreciates that he is slightly better today than yesterday.  Afebrile overnight, T-max was 100, much better than 2 nights ago.  Abdominal pain is less, he is able to ambulate a little bit more.  Asking about eating more  Assessment & Plan: Principal Problem Acute pancreatitis, secondary to EtOH and hypertriglyceridemia -initially admitted to the ICU, received insulin and dextrose infusion, triglycerides now less than 500 for the past 36 hours, off infusion with dextrose/insulin -Continue supportive management, pain control, IV fluids -Given clinical improvement will advance to full liquids  Active Problems Leukocytosis, fever, tachycardia-in the setting of acute pancreatitis.  Trend improving, hold off on reimaging  Essential hypertension-continue amlodipine  Acute kidney injury, hypomagnesemia, hyponatremia, hypocalcemia, non-anion gap metabolic acidosis-in the setting of fluid losses, aggressive hydration  Polysubstance use-determined to quit  Scheduled Meds: . amLODipine  10 mg Oral Daily  . Chlorhexidine Gluconate Cloth  6 each Topical Q0600  . heparin  5,000 Units Subcutaneous Q8H  . thiamine injection  100 mg Intravenous Daily   Continuous Infusions: . sodium chloride Stopped (10/01/20 1059)   . sodium chloride 125 mL/hr at 10/04/20 2356  . ondansetron [facility]) IV Stopped (09/30/20 2014)   PRN Meds:.sodium chloride, docusate sodium, hydrALAZINE, HYDROmorphone (DILAUDID) injection, labetalol, LORazepam, ondansetron (ZOFRAN) IV, oxyCODONE, polyethylene glycol  Diet Orders (From admission, onward)    Start     Ordered   10/05/20 0903  Diet full liquid Room service appropriate? Yes; Fluid consistency: Thin  Diet effective now       Question Answer Comment  Room service appropriate? Yes   Fluid consistency: Thin      10/05/20 0902          DVT prophylaxis: heparin injection 5,000 Units Start: 09/30/20 0600 SCDs Start: 09/30/20 0121     Code Status: Full Code  Family Communication: no family present  Status is: Inpatient  Remains inpatient appropriate because:Inpatient level of care appropriate due to severity of illness   Dispo: The patient is from: Home              Anticipated d/c is to: Home              Patient currently is not medically stable to d/c.   Difficult to place patient No  Level of care: Progressive  Consultants:  PCCM  Procedures:  none  Microbiology  none  Antimicrobials: none    Objective: Vitals:   10/05/20 0417 10/05/20 0500 10/05/20 0521 10/05/20 0706  BP: (!) 176/98  (!) 145/92 (!) 157/91  Pulse: (!) 120  (!) 106 (!) 107  Resp: (!) 38  (!) 22 (!) 21  Temp: 100 F (37.8 C)  98.5 F (36.9 C) 99.6 F (37.6 C)  TempSrc: Oral  Oral Oral  SpO2: 95%  95% 93%  Weight:  102.1 kg  Height:        Intake/Output Summary (Last 24 hours) at 10/05/2020 0950 Last data filed at 10/05/2020 0536 Gross per 24 hour  Intake 2250 ml  Output 1560 ml  Net 690 ml   Filed Weights   10/01/20 0500 10/03/20 0345 10/05/20 0500  Weight: 106.5 kg 104.1 kg 102.1 kg    Examination:  Constitutional: No distress Eyes: No icterus ENMT: mmm.  Neck: normal, supple Respiratory: Clear bilaterally no wheezing or crackles, normal respiratory  effort Cardiovascular: Regular rate and rhythm, no murmurs, no edema Abdomen: Mildly distended, no tenderness today, no guarding or rebound Musculoskeletal: no clubbing / cyanosis.  Skin: No rashes seen Neurologic: No focal deficits  Data Reviewed: I have independently reviewed following labs and imaging studies   CBC: Recent Labs  Lab 09/30/20 0113 09/30/20 0144 09/30/20 0657 10/01/20 0121 10/03/20 0935 10/05/20 0205  WBC 15.4*  --  14.6* 9.6 10.4 13.8*  NEUTROABS 14.0*  --   --   --   --   --   HGB 15.7 15.6 14.7 12.1* 11.8* 11.1*  HCT 44.8 46.0 42.7 36.1* 35.1* 32.6*  MCV 94.1  --  95.5 95.5 96.7 96.2  PLT 221  --  194 146* 143* 162   Basic Metabolic Panel: Recent Labs  Lab 09/30/20 0621 10/01/20 0121 10/02/20 0223 10/03/20 0935 10/05/20 0205  NA 130* 129* 131* 131* 131*  K 4.7 3.5 3.8 4.3 3.9  CL 102 100 96* 96* 100  CO2 19* 23 26 27 23   GLUCOSE 110* 82 146* 125* 95  BUN 26* 16 11 12 14   CREATININE 1.37* 1.01 0.89 0.91 0.93  CALCIUM 6.6* 6.7* 7.8* 8.4* 8.2*  MG 2.1  --   --  2.0 1.9  PHOS 2.6  --   --   --  3.9   Liver Function Tests: Recent Labs  Lab 09/30/20 0621 10/01/20 0121 10/01/20 0754 10/03/20 0935 10/05/20 0205  AST 48* 29 28 28 19   ALT 8 14 15  41 30  ALKPHOS 45 45 48 106 102  BILITOT 1.7* 1.4* 1.4* 2.1* 1.3*  PROT 5.6* 5.5* 5.4* 6.0* 5.6*  ALBUMIN 2.6* 2.5* 2.4* 2.4* 2.2*   Coagulation Profile: Recent Labs  Lab 09/30/20 0113  INR 1.2   HbA1C: No results for input(s): HGBA1C in the last 72 hours. CBG: Recent Labs  Lab 10/02/20 2112 10/04/20 1950 10/04/20 2354 10/05/20 0523 10/05/20 0947  GLUCAP 133* 133* 104* 99 115*    Recent Results (from the past 240 hour(s))  MRSA PCR Screening     Status: None   Collection Time: 09/29/20 11:07 PM   Specimen: Nasal Mucosa; Nasopharyngeal  Result Value Ref Range Status   MRSA by PCR NEGATIVE NEGATIVE Final    Comment:        The GeneXpert MRSA Assay (FDA approved for NASAL  specimens only), is one component of a comprehensive MRSA colonization surveillance program. It is not intended to diagnose MRSA infection nor to guide or monitor treatment for MRSA infections. Performed at [facility], [address], [place], 11/29/20 [facility]      Radiology Studies: No results found.   [address], MD, PhD [facility]  Between 7 am - 7 pm I am available, please contact me via Amion (for emergencies) or Securechat (non urgent messages)  Between 7 pm - 7 am I am not available, please contact night coverage MD/APP via Amion

## 2020-10-05 NOTE — Plan of Care (Signed)
  Problem: Clinical Measurements: Goal: Will remain free from infection Outcome: Progressing   Problem: Education: Goal: Knowledge of General Education information will improve Description: Including pain rating scale, medication(s)/side effects and non-pharmacologic comfort measures Outcome: Progressing   Problem: Health Behavior/Discharge Planning: Goal: Ability to manage health-related needs will improve Outcome: Progressing   Problem: Clinical Measurements: Goal: Ability to maintain clinical measurements within normal limits will improve Outcome: Progressing Goal: Will remain free from infection Outcome: Progressing Goal: Diagnostic test results will improve Outcome: Progressing Goal: Respiratory complications will improve Outcome: Progressing Goal: Cardiovascular complication will be avoided Outcome: Progressing   Problem: Activity: Goal: Risk for activity intolerance will decrease Outcome: Progressing   Problem: Nutrition: Goal: Adequate nutrition will be maintained Outcome: Progressing   Problem: Coping: Goal: Level of anxiety will decrease Outcome: Progressing   Problem: Elimination: Goal: Will not experience complications related to bowel motility Outcome: Progressing Goal: Will not experience complications related to urinary retention Outcome: Progressing   Problem: Pain Managment: Goal: General experience of comfort will improve Outcome: Progressing   Problem: Safety: Goal: Ability to remain free from injury will improve Outcome: Progressing   Problem: Skin Integrity: Goal: Risk for impaired skin integrity will decrease Outcome: Progressing   

## 2020-10-06 ENCOUNTER — Inpatient Hospital Stay (HOSPITAL_COMMUNITY): Payer: Medicaid Other

## 2020-10-06 LAB — COMPREHENSIVE METABOLIC PANEL
ALT: 30 U/L (ref 0–44)
AST: 27 U/L (ref 15–41)
Albumin: 2.2 g/dL — ABNORMAL LOW (ref 3.5–5.0)
Alkaline Phosphatase: 119 U/L (ref 38–126)
Anion gap: 11 (ref 5–15)
BUN: 15 mg/dL (ref 6–20)
CO2: 22 mmol/L (ref 22–32)
Calcium: 8.1 mg/dL — ABNORMAL LOW (ref 8.9–10.3)
Chloride: 98 mmol/L (ref 98–111)
Creatinine, Ser: 0.9 mg/dL (ref 0.61–1.24)
GFR, Estimated: >60 mL/min (ref >60)
Glucose, Bld: 112 mg/dL — ABNORMAL HIGH (ref 70–99)
Potassium: 4.1 mmol/L (ref 3.5–5.1)
Sodium: 131 mmol/L — ABNORMAL LOW (ref 135–145)
Total Bilirubin: 1.2 mg/dL (ref 0.3–1.2)
Total Protein: 5.6 g/dL — ABNORMAL LOW (ref 6.5–8.1)

## 2020-10-06 LAB — CBC
HCT: 33.1 % — ABNORMAL LOW (ref 39.0–52.0)
Hemoglobin: 11 g/dL — ABNORMAL LOW (ref 13.0–17.0)
MCH: 32 pg (ref 26.0–34.0)
MCHC: 33.2 g/dL (ref 30.0–36.0)
MCV: 96.2 fL (ref 80.0–100.0)
Platelets: 198 10*3/uL (ref 150–400)
RBC: 3.44 MIL/uL — ABNORMAL LOW (ref 4.22–5.81)
RDW: 14.6 % (ref 11.5–15.5)
WBC: 18 10*3/uL — ABNORMAL HIGH (ref 4.0–10.5)
nRBC: 0 % (ref 0.0–0.2)

## 2020-10-06 LAB — GLUCOSE, CAPILLARY: Glucose-Capillary: 114 mg/dL — ABNORMAL HIGH (ref 70–99)

## 2020-10-06 LAB — LACTIC ACID, PLASMA: Lactic Acid, Venous: 0.8 mmol/L (ref 0.5–1.9)

## 2020-10-06 LAB — PROCALCITONIN: Procalcitonin: 1.22 ng/mL

## 2020-10-06 MED ORDER — HYDROMORPHONE HCL 1 MG/ML IJ SOLN
0.5000 mg | INTRAMUSCULAR | Status: DC | PRN
  Administered 2020-10-06 – 2020-10-10 (×9): 0.5 mg via INTRAVENOUS
  Filled 2020-10-06 (×10): qty 1

## 2020-10-06 MED ORDER — OXYCODONE HCL 5 MG PO TABS
5.0000 mg | ORAL_TABLET | Freq: Four times a day (QID) | ORAL | Status: DC | PRN
  Administered 2020-10-06 – 2020-10-09 (×9): 10 mg via ORAL
  Administered 2020-10-09 (×2): 5 mg via ORAL
  Administered 2020-10-10 – 2020-10-13 (×8): 10 mg via ORAL
  Administered 2020-10-13: 5 mg via ORAL
  Filled 2020-10-06 (×18): qty 2
  Filled 2020-10-06: qty 1
  Filled 2020-10-06: qty 2

## 2020-10-06 MED ORDER — AMOXICILLIN-POT CLAVULANATE 875-125 MG PO TABS
1.0000 | ORAL_TABLET | Freq: Two times a day (BID) | ORAL | Status: DC
  Administered 2020-10-06 (×2): 1 via ORAL
  Filled 2020-10-06 (×3): qty 1

## 2020-10-06 MED ORDER — THIAMINE HCL 100 MG PO TABS
100.0000 mg | ORAL_TABLET | Freq: Every day | ORAL | Status: DC
  Administered 2020-10-07 – 2020-10-13 (×7): 100 mg via ORAL
  Filled 2020-10-06 (×8): qty 1

## 2020-10-06 NOTE — Hospital Course (Signed)
30 year old male with history of EtOH, tobacco and marijuana use, remote seizures came into the hospital with abdominal pain, diagnosed with acute pancreatitis.  2 weeks ago he was admitted to [facility] ED, diagnosed with alcoholic pancreatitis and discharged home.  He has not had any alcohol since.  His abdominal pain recurred and came back to the hospital, he was found to have triglycerides of 2400 and was sent to [facility], ICU to be evaluated for possible Plex for hypertriglyceridemia.

## 2020-10-06 NOTE — Plan of Care (Signed)
  Problem: Clinical Measurements: Goal: Will remain free from infection Outcome: Progressing   Problem: Education: Goal: Knowledge of General Education information will improve Description: Including pain rating scale, medication(s)/side effects and non-pharmacologic comfort measures Outcome: Progressing   Problem: Health Behavior/Discharge Planning: Goal: Ability to manage health-related needs will improve Outcome: Progressing   Problem: Clinical Measurements: Goal: Ability to maintain clinical measurements within normal limits will improve Outcome: Progressing Goal: Will remain free from infection Outcome: Progressing Goal: Diagnostic test results will improve Outcome: Progressing Goal: Respiratory complications will improve Outcome: Progressing Goal: Cardiovascular complication will be avoided Outcome: Progressing   Problem: Activity: Goal: Risk for activity intolerance will decrease Outcome: Progressing   Problem: Nutrition: Goal: Adequate nutrition will be maintained Outcome: Progressing   Problem: Coping: Goal: Level of anxiety will decrease Outcome: Progressing   Problem: Elimination: Goal: Will not experience complications related to bowel motility Outcome: Progressing Goal: Will not experience complications related to urinary retention Outcome: Progressing   Problem: Pain Managment: Goal: General experience of comfort will improve Outcome: Progressing   Problem: Safety: Goal: Ability to remain free from injury will improve Outcome: Progressing   Problem: Skin Integrity: Goal: Risk for impaired skin integrity will decrease Outcome: Progressing   

## 2020-10-06 NOTE — Progress Notes (Addendum)
PROGRESS NOTE    De Jaworski   RWE:[id number]  DOB: [birth year]  PCP: No primary care provider on file.    DOA: 09/29/2020 LOS: 7   Brief Narrative   30 year old male with history of EtOH, tobacco and marijuana use, remote seizures came into the hospital with abdominal pain, diagnosed with acute pancreatitis.  2 weeks ago he was admitted to [facility] ED, diagnosed with alcoholic pancreatitis and discharged home.  He has not had any alcohol since.  His abdominal pain recurred and came back to the hospital, he was found to have triglycerides of 2400 and was sent to [facility], ICU to be evaluated for possible Plex for hypertriglyceridemia.    Assessment & Plan   Principal Problem:   Acute pancreatitis Active Problems:   Hypertriglyceridemia   AKI (acute kidney injury) (HCC)   Leukocytosis   Acute pancreatitis due to EtOH and hypertriglyceridemia - initially admitted to ICU, treated with insulin and dextrose infusion, now off.  TG's now 295 (less than 500 since 5/4). --Supportive care: IVF's (reduce to 75/hr), pain control --On full liquid diet, advance as tolerated  Left lower lobe pneumonia - CXR obtained today 5/9 due to worsened leukocytosis and diminished lung bases on exam, showed LLL infiltrate and small pleural effusion. --Augmentin started, treat x 5 days --Pt denies any respiratory symptoms --Follow up procal and lactate  Sepsis due to Pneumonia vs SIRS from pancreatitis. With leukocytosis, fever, tachycardia PNA identified on CXR 5/9 but was not excluded on xray of 5/3 with bibasilar atelectasis vs infiltrate. --Sepsis physiology improving --Abx as above --IVF's   AKI Hypomagnesemia Hypokalema Hypocalcemia Non-anion gap metabolic acidosis All in setting of hypovolemia and improved with IV hydration. Monitor.  Hypertension - continue amlodipine  Polysubstance abuse - motivated and determined to quit.  Last drink of EtOH now 4 weeks  ago.   Patient BMI: Body mass index is 29.81 kg/m.   DVT prophylaxis: heparin injection 5,000 Units Start: 09/30/20 0600 SCDs Start: 09/30/20 0121   Diet:  Diet Orders (From admission, onward)    Start     Ordered   10/05/20 0903  Diet full liquid Room service appropriate? Yes; Fluid consistency: Thin  Diet effective now       Question Answer Comment  Room service appropriate? Yes   Fluid consistency: Thin      10/05/20 0902            Code Status: Full Code    Subjective 10/06/20    Pt did okay with full liquids but could only tolerate very little bit.  Prefers to hold off advancing diet further for now.  No fever/chills.  Abdominal pain improving, nausea comes and goes.    Disposition Plan & Communication   Status is: Inpatient  Remains inpatient appropriate because:IV treatments appropriate due to intensity of illness or inability to take PO   Dispo: The patient is from: Home              Anticipated d/c is to: Home              Patient currently is not medically stable to d/c.   Difficult to place patient No    Consults, Procedures, Significant Events   Consultants:   None  Procedures:   None  Antimicrobials:  Anti-infectives (From admission, onward)   Start     Dose/Rate Route Frequency Ordered Stop   10/06/20 1315  amoxicillin-clavulanate (AUGMENTIN) 875-125 MG per tablet 1 tablet  1 tablet Oral Every 12 hours 10/06/20 1227          Micro    Objective   Vitals:   10/06/20 0455 10/06/20 0500 10/06/20 0811 10/06/20 0821  BP: (!) 164/88  (!) 149/83   Pulse:   (!) 111 (!) 106  Resp:   (!) 24 (!) 22  Temp:   98.5 F (36.9 C)   TempSrc:      SpO2:   93% 94%  Weight:  102.5 kg    Height:        Intake/Output Summary (Last 24 hours) at 10/06/2020 1321 Last data filed at 10/06/2020 0700 Gross per 24 hour  Intake 1000 ml  Output 1730 ml  Net -730 ml   Filed Weights   10/03/20 0345 10/05/20 0500 10/06/20 0500  Weight: 104.1 kg  102.1 kg 102.5 kg    Physical Exam:  General exam: awake, alert, no acute distress HEENT: atraumatic, clear conjunctiva, anicteric sclera, moist mucus membranes, hearing grossly normal  Respiratory system: CTAB, no wheezes, rales or rhonchi, normal respiratory effort. Cardiovascular system: normal S1/S2, RRR, no JVD, murmurs, rubs, gallops, no pedal edema.   Gastrointestinal system: soft, epigastric tenderness Central nervous system: A&O x4. no gross focal neurologic deficits, normal speech Skin: dry, intact, normal temperature Psychiatry: normal mood, congruent affect, judgement and insight appear normal  Labs   Data Reviewed: I have personally reviewed following labs and imaging studies  CBC: Recent Labs  Lab 09/30/20 0113 09/30/20 0144 09/30/20 0657 10/01/20 0121 10/03/20 0935 10/05/20 0205 10/06/20 0440  WBC 15.4*  --  14.6* 9.6 10.4 13.8* 18.0*  NEUTROABS 14.0*  --   --   --   --   --   --   HGB 15.7   < > 14.7 12.1* 11.8* 11.1* 11.0*  HCT 44.8   < > 42.7 36.1* 35.1* 32.6* 33.1*  MCV 94.1  --  95.5 95.5 96.7 96.2 96.2  PLT 221  --  194 146* 143* 162 198   < > = values in this interval not displayed.   Basic Metabolic Panel: Recent Labs  Lab 09/30/20 0621 10/01/20 0121 10/02/20 0223 10/03/20 0935 10/05/20 0205 10/06/20 0440  NA 130* 129* 131* 131* 131* 131*  K 4.7 3.5 3.8 4.3 3.9 4.1  CL 102 100 96* 96* 100 98  CO2 19* 23 26 27 23 22   GLUCOSE 110* 82 146* 125* 95 112*  BUN 26* 16 11 12 14 15   CREATININE 1.37* 1.01 0.89 0.91 0.93 0.90  CALCIUM 6.6* 6.7* 7.8* 8.4* 8.2* 8.1*  MG 2.1  --   --  2.0 1.9  --   PHOS 2.6  --   --   --  3.9  --    GFR: Estimated Creatinine Clearance: 152.3 mL/min (by C-G formula based on SCr of 0.9 mg/dL). Liver Function Tests: Recent Labs  Lab 10/01/20 0121 10/01/20 0754 10/03/20 0935 10/05/20 0205 10/06/20 0440  AST 29 28 28 19 27   ALT 14 15 41 30 30  ALKPHOS 45 48 106 102 119  BILITOT 1.4* 1.4* 2.1* 1.3* 1.2  PROT  5.5* 5.4* 6.0* 5.6* 5.6*  ALBUMIN 2.5* 2.4* 2.4* 2.2* 2.2*   Recent Labs  Lab 09/30/20 0113 10/05/20 0205  LIPASE 180* 43   No results for input(s): AMMONIA in the last 168 hours. Coagulation Profile: Recent Labs  Lab 09/30/20 0113  INR 1.2   Cardiac Enzymes: No results for input(s): CKTOTAL, CKMB, CKMBINDEX, TROPONINI in the last 168  hours. BNP (last 3 results) No results for input(s): PROBNP in the last 8760 hours. HbA1C: No results for input(s): HGBA1C in the last 72 hours. CBG: Recent Labs  Lab 10/05/20 0523 10/05/20 0947 10/05/20 1244 10/05/20 1959 10/06/20 0331  GLUCAP 99 115* 124* 107* 114*   Lipid Profile: No results for input(s): CHOL, HDL, LDLCALC, TRIG, CHOLHDL, LDLDIRECT in the last 72 hours. Thyroid Function Tests: No results for input(s): TSH, T4TOTAL, FREET4, T3FREE, THYROIDAB in the last 72 hours. Anemia Panel: No results for input(s): VITAMINB12, FOLATE, FERRITIN, TIBC, IRON, RETICCTPCT in the last 72 hours. Sepsis Labs: No results for input(s): PROCALCITON, LATICACIDVEN in the last 168 hours.  Recent Results (from the past 240 hour(s))  MRSA PCR Screening     Status: None   Collection Time: 09/29/20 11:07 PM   Specimen: Nasal Mucosa; Nasopharyngeal  Result Value Ref Range Status   MRSA by PCR NEGATIVE NEGATIVE Final    Comment:        The GeneXpert MRSA Assay (FDA approved for NASAL specimens only), is one component of a comprehensive MRSA colonization surveillance program. It is not intended to diagnose MRSA infection nor to guide or monitor treatment for MRSA infections. Performed at [facility], [address] [place], [place] [address]       Imaging Studies   DG CHEST PORT 1 VIEW  Result Date: 10/06/2020 CLINICAL DATA:  Leukocytosis.  Short of breath on exertion. EXAM: PORTABLE CHEST 1 VIEW COMPARISON:  09/30/2020. FINDINGS: There is consolidation at the left lung base obscuring hemidiaphragm. Mild hazy opacity extends to  the left peripheral mid lung. Remainder of the lungs is clear. No right pleural effusion.  No pneumothorax. Heart, mediastinum hila are unremarkable. Skeletal structures are grossly intact. IMPRESSION: 1. Left lower lobe consolidation consistent with pneumonia. Possible associated small effusion. Electronically Signed   By: Amie Portland M.D.   On: 10/06/2020 11:17     Medications   Scheduled Meds: . amLODipine  10 mg Oral Daily  . amoxicillin-clavulanate  1 tablet Oral Q12H  . Chlorhexidine Gluconate Cloth  6 each Topical Q0600  . heparin  5,000 Units Subcutaneous Q8H  . thiamine injection  100 mg Intravenous Daily   Continuous Infusions: . sodium chloride Stopped (10/01/20 1059)  . ondansetron (ZOFRAN) IV Stopped (09/30/20 2014)       LOS: 7 days    Time spent: 30 minutes    Pennie Banter, DO [facility]  10/06/2020, 1:21 PM      If 7PM-7AM, please contact night-coverage. How to contact the [facility] Attending or Consulting provider 7A - 7P or covering provider during after hours 7P -7A, for this patient?    1. Check the care team in [facility] and look for a) attending/consulting [facility] provider listed and b) the [facility] team listed 2. Log into [email or web address] and use [place]'s universal password to access. If you do not have the password, please contact the hospital operator. 3. Locate the [place] provider you are looking for under [facility] and page to a number that you can be directly reached. 4. If you still have difficulty reaching the provider, please page the [facility] (Director on Call) for the Hospitalists listed on amion for assistance.

## 2020-10-06 NOTE — Progress Notes (Signed)
Patient c/o SOB with activity.  MD aware.  Patient ambulated in hall, oxygen saturations remained 98-100% with ambulation.  Back to room to shower.  Patient given incentive spirometer d/t new finding of pneumonia, educated and instructed on use.  Patient performed correctly and verbalizes understanding regarding importance of use.

## 2020-10-07 ENCOUNTER — Inpatient Hospital Stay (HOSPITAL_COMMUNITY): Payer: Medicaid Other

## 2020-10-07 DIAGNOSIS — R935 Abnormal findings on diagnostic imaging of other abdominal regions, including retroperitoneum: Secondary | ICD-10-CM

## 2020-10-07 DIAGNOSIS — K851 Biliary acute pancreatitis without necrosis or infection: Secondary | ICD-10-CM

## 2020-10-07 LAB — TRIGLYCERIDES: Triglycerides: 255 mg/dL — ABNORMAL HIGH (ref <150)

## 2020-10-07 LAB — CBC
HCT: 31.6 % — ABNORMAL LOW (ref 39.0–52.0)
Hemoglobin: 10.3 g/dL — ABNORMAL LOW (ref 13.0–17.0)
MCH: 31.8 pg (ref 26.0–34.0)
MCHC: 32.6 g/dL (ref 30.0–36.0)
MCV: 97.5 fL (ref 80.0–100.0)
Platelets: 210 10*3/uL (ref 150–400)
RBC: 3.24 MIL/uL — ABNORMAL LOW (ref 4.22–5.81)
RDW: 14.5 % (ref 11.5–15.5)
WBC: 20.8 10*3/uL — ABNORMAL HIGH (ref 4.0–10.5)
nRBC: 0.1 % (ref 0.0–0.2)

## 2020-10-07 LAB — BASIC METABOLIC PANEL
Anion gap: 12 (ref 5–15)
BUN: 18 mg/dL (ref 6–20)
CO2: 22 mmol/L (ref 22–32)
Calcium: 8 mg/dL — ABNORMAL LOW (ref 8.9–10.3)
Chloride: 95 mmol/L — ABNORMAL LOW (ref 98–111)
Creatinine, Ser: 0.96 mg/dL (ref 0.61–1.24)
GFR, Estimated: >60 mL/min (ref >60)
Glucose, Bld: 105 mg/dL — ABNORMAL HIGH (ref 70–99)
Potassium: 4.2 mmol/L (ref 3.5–5.1)
Sodium: 129 mmol/L — ABNORMAL LOW (ref 135–145)

## 2020-10-07 LAB — STREP PNEUMONIAE URINARY ANTIGEN: Strep Pneumo Urinary Antigen: NEGATIVE

## 2020-10-07 LAB — MAGNESIUM: Magnesium: 1.9 mg/dL (ref 1.7–2.4)

## 2020-10-07 LAB — PROCALCITONIN: Procalcitonin: 1.31 ng/mL

## 2020-10-07 MED ORDER — IOHEXOL 9 MG/ML PO SOLN
ORAL | Status: AC
  Administered 2020-10-07: 500 mL
  Filled 2020-10-07: qty 1000

## 2020-10-07 MED ORDER — PIPERACILLIN-TAZOBACTAM 3.375 G IVPB
3.3750 g | Freq: Three times a day (TID) | INTRAVENOUS | Status: DC
  Administered 2020-10-07 – 2020-10-13 (×17): 3.375 g via INTRAVENOUS
  Filled 2020-10-07 (×18): qty 50

## 2020-10-07 MED ORDER — SODIUM CHLORIDE 0.9 % IV SOLN: INTRAVENOUS | Status: DC

## 2020-10-07 MED ORDER — ACETAMINOPHEN 325 MG PO TABS
650.0000 mg | ORAL_TABLET | Freq: Four times a day (QID) | ORAL | Status: DC | PRN
  Administered 2020-10-07 – 2020-10-08 (×2): 650 mg via ORAL
  Filled 2020-10-07 (×2): qty 2

## 2020-10-07 MED ORDER — LABETALOL HCL 5 MG/ML IV SOLN
20.0000 mg | INTRAVENOUS | Status: DC | PRN
  Administered 2020-10-07 – 2020-10-08 (×2): 20 mg via INTRAVENOUS
  Filled 2020-10-07 (×2): qty 4

## 2020-10-07 MED ORDER — IOHEXOL 300 MG/ML  SOLN
75.0000 mL | Freq: Once | INTRAMUSCULAR | Status: AC | PRN
  Administered 2020-10-07: 75 mL via INTRAVENOUS

## 2020-10-07 MED ORDER — AZITHROMYCIN 500 MG PO TABS
500.0000 mg | ORAL_TABLET | Freq: Every day | ORAL | Status: DC
  Administered 2020-10-07: 500 mg via ORAL
  Filled 2020-10-07: qty 1

## 2020-10-07 MED ORDER — SODIUM CHLORIDE 0.9 % IV SOLN
2.0000 g | INTRAVENOUS | Status: DC
  Administered 2020-10-07: 2 g via INTRAVENOUS
  Filled 2020-10-07: qty 20

## 2020-10-07 NOTE — Progress Notes (Addendum)
PROGRESS NOTE    Dennis [name]   IRW:[id number]  DOB: Apr 20, 1991  PCP: No primary care provider on file.    DOA: 09/29/2020 LOS: 8   Brief Narrative   30 year old male with history of EtOH, tobacco and marijuana use, remote seizures came into the hospital with abdominal pain, diagnosed with acute pancreatitis.  2 weeks ago he was admitted to [facility] ED, diagnosed with alcoholic pancreatitis and discharged home.  He has not had any alcohol since.  His abdominal pain recurred and came back to the hospital, he was found to have triglycerides of 2400 and was sent to [facility], ICU to be evaluated for possible [place] for hypertriglyceridemia.    Assessment & Plan   Principal Problem:   Acute pancreatitis Active Problems:   Hypertriglyceridemia   AKI (acute kidney injury) (HCC)   Leukocytosis   ADDENDUM - CT c/a/p showed fluid collections, likely pseudocysts.  Given fevers, sepsis picture, concern for infection. Does appear to have LLL PNA.  --Switch antibiotics to Zosyn --GI consulted  Acute pancreatitis due to EtOH and hypertriglyceridemia - initially admitted to ICU, treated with insulin and dextrose infusion, now off.  TG's now 295 (less than 500 since 5/4). --Supportive care: IVF's (reduce to 75/hr), pain control --On full liquid diet, advance as tolerated --CT chest/abdomen/pelvis - pending  Left lower lobe pneumonia - CXR obtained today 5/9 due to worsened leukocytosis and diminished lung bases on exam, showed LLL infiltrate and small pleural effusion. --D/C Augmentin --Start Rocephin & Zithromax --Pt denies any respiratory symptoms --Follow up procal and lactate --Follow up CT chest/abd/pelvis for further assessment of PNA and pancreas  Sepsis due to Pneumonia vs SIRS from pancreatitis. With leukocytosis, fever, tachycardia PNA identified on CXR 5/9 but was not excluded on xray of 5/3 with bibasilar atelectasis vs infiltrate. --Sepsis physiology  improving --Abx as above --IVF's - on NS @ 100//hr  Hyponatremia - Na 129 this AM. Fluids had expired and poor PO intake.  Resumed fluids.  BMP in AM.  Legionella antigen negative.   AKI Hypomagnesemia Hypokalema Hypocalcemia Non-anion gap metabolic acidosis All in setting of hypovolemia and improved with IV hydration. Monitor.  Hypertension - continue amlodipine  Polysubstance abuse - motivated and determined to quit.  Last drink of EtOH now 4 weeks ago.   Patient BMI: Body mass index is 29.7 kg/m.   DVT prophylaxis: heparin injection 5,000 Units Start: 09/30/20 0600 SCDs Start: 09/30/20 0121   Diet:  Diet Orders (From admission, onward)    Start     Ordered   10/05/20 0903  Diet full liquid Room service appropriate? Yes; Fluid consistency: Thin  Diet effective now       Question Answer Comment  Room service appropriate? Yes   Fluid consistency: Thin      10/05/20 0902            Code Status: Full Code    Subjective 10/07/20    Pt spiked fever up to 102.7 around midnight.  Says he felt hot and cold all night.  Feels okay this AM, not great.  Still denies any respiratory symptoms.  Feels like belly might be little softer.     Disposition Plan & Communication   Status is: Inpatient  Remains inpatient appropriate because:IV treatments appropriate due to intensity of illness or inability to take PO   Dispo: The patient is from: Home              Anticipated d/c is to: Home  Patient currently is not medically stable to d/c.   Difficult to place patient No    Consults, Procedures, Significant Events   Consultants:   None  Procedures:   None  Antimicrobials:  Anti-infectives (From admission, onward)   Start     Dose/Rate Route Frequency Ordered Stop   10/07/20 1000  azithromycin (ZITHROMAX) tablet 500 mg        500 mg Oral Daily 10/07/20 0749 10/12/20 0959   10/07/20 0845  cefTRIAXone (ROCEPHIN) 2 g in sodium chloride 0.9 % 100 mL IVPB         2 g 200 mL/hr over 30 Minutes Intravenous Every 24 hours 10/07/20 0749 10/12/20 0844   10/06/20 1315  amoxicillin-clavulanate (AUGMENTIN) 875-125 MG per tablet 1 tablet  Status:  Discontinued        1 tablet Oral Every 12 hours 10/06/20 1227 10/07/20 0749        Micro    Objective   Vitals:   10/07/20 0352 10/07/20 0407 10/07/20 0755 10/07/20 1154  BP: (!) 163/94 (!) 158/95 (!) 148/87 (!) 155/88  Pulse: (!) 117 (!) 115 (!) 114   Resp: 20  (!) 26 20  Temp: 99.4 F (37.4 C)  100 F (37.8 C) 98.1 F (36.7 C)  TempSrc: Oral  Oral Oral  SpO2: 93% 94% 94%   Weight:  102.1 kg    Height:        Intake/Output Summary (Last 24 hours) at 10/07/2020 1435 Last data filed at 10/07/2020 1100 Gross per 24 hour  Intake 600 ml  Output 775 ml  Net -175 ml   Filed Weights   10/05/20 0500 10/06/20 0500 10/07/20 0407  Weight: 102.1 kg 102.5 kg 102.1 kg    Physical Exam:  General exam: awake, alert, no acute distress HEENT: moist mucus membranes, hearing grossly normal  Respiratory system: diminished bases, no wheezes or rhonchi, normal respiratory effort, on room air. Cardiovascular system: RRR, no pedal edema.   Gastrointestinal system: distended, epigastric tenderness Central nervous system: A&O x4. normal speech Psychiatry: normal mood, congruent affect, judgement and insight appear normal  Labs   Data Reviewed: I have personally reviewed following labs and imaging studies  CBC: Recent Labs  Lab 10/01/20 0121 10/03/20 0935 10/05/20 0205 10/06/20 0440 10/07/20 0258  WBC 9.6 10.4 13.8* 18.0* 20.8*  HGB 12.1* 11.8* 11.1* 11.0* 10.3*  HCT 36.1* 35.1* 32.6* 33.1* 31.6*  MCV 95.5 96.7 96.2 96.2 97.5  PLT 146* 143* 162 198 210   Basic Metabolic Panel: Recent Labs  Lab 10/02/20 0223 10/03/20 0935 10/05/20 0205 10/06/20 0440 10/07/20 0258  NA 131* 131* 131* 131* 129*  K 3.8 4.3 3.9 4.1 4.2  CL 96* 96* 100 98 95*  CO2 26 27 23 22 22   GLUCOSE 146* 125* 95 112*  105*  BUN 11 12 14 15 18   CREATININE 0.89 0.91 0.93 0.90 0.96  CALCIUM 7.8* 8.4* 8.2* 8.1* 8.0*  MG  --  2.0 1.9  --  1.9  PHOS  --   --  3.9  --   --    GFR: Estimated Creatinine Clearance: 142.6 mL/min (by C-G formula based on SCr of 0.96 mg/dL). Liver Function Tests: Recent Labs  Lab 10/01/20 0121 10/01/20 0754 10/03/20 0935 10/05/20 0205 10/06/20 0440  AST 29 28 28 19 27   ALT 14 15 41 30 30  ALKPHOS 45 48 106 102 119  BILITOT 1.4* 1.4* 2.1* 1.3* 1.2  PROT 5.5* 5.4* 6.0* 5.6* 5.6*  ALBUMIN 2.5*  2.4* 2.4* 2.2* 2.2*   Recent Labs  Lab 10/05/20 0205  LIPASE 43   No results for input(s): AMMONIA in the last 168 hours. Coagulation Profile: No results for input(s): INR, PROTIME in the last 168 hours. Cardiac Enzymes: No results for input(s): CKTOTAL, CKMB, CKMBINDEX, TROPONINI in the last 168 hours. BNP (last 3 results) No results for input(s): PROBNP in the last 8760 hours. HbA1C: No results for input(s): HGBA1C in the last 72 hours. CBG: Recent Labs  Lab 10/05/20 0523 10/05/20 0947 10/05/20 1244 10/05/20 1959 10/06/20 0331  GLUCAP 99 115* 124* 107* 114*   Lipid Profile: Recent Labs    10/07/20 0258  TRIG 255*   Thyroid Function Tests: No results for input(s): TSH, T4TOTAL, FREET4, T3FREE, THYROIDAB in the last 72 hours. Anemia Panel: No results for input(s): VITAMINB12, FOLATE, FERRITIN, TIBC, IRON, RETICCTPCT in the last 72 hours. Sepsis Labs: Recent Labs  Lab 10/06/20 1448 10/07/20 0924  PROCALCITON 1.22 1.31  LATICACIDVEN 0.8  --     Recent Results (from the past 240 hour(s))  MRSA PCR Screening     Status: None   Collection Time: 09/29/20 11:07 PM   Specimen: Nasal Mucosa; Nasopharyngeal  Result Value Ref Range Status   MRSA by PCR NEGATIVE NEGATIVE Final    Comment:        The GeneXpert MRSA Assay (FDA approved for NASAL specimens only), is one component of a comprehensive MRSA colonization surveillance program. It is not intended to  diagnose MRSA infection nor to guide or monitor treatment for MRSA infections. Performed at [facility], [address] [place], [place] [address]       Imaging Studies   DG CHEST PORT 1 VIEW  Result Date: 10/06/2020 CLINICAL DATA:  Leukocytosis.  Short of breath on exertion. EXAM: PORTABLE CHEST 1 VIEW COMPARISON:  09/30/2020. FINDINGS: There is consolidation at the left lung base obscuring hemidiaphragm. Mild hazy opacity extends to the left peripheral mid lung. Remainder of the lungs is clear. No right pleural effusion.  No pneumothorax. Heart, mediastinum hila are unremarkable. Skeletal structures are grossly intact. IMPRESSION: 1. Left lower lobe consolidation consistent with pneumonia. Possible associated small effusion. Electronically Signed   By: Amie Portland M.D.   On: 10/06/2020 11:17     Medications   Scheduled Meds: . amLODipine  10 mg Oral Daily  . azithromycin  500 mg Oral Daily  . Chlorhexidine Gluconate Cloth  6 each Topical Q0600  . heparin  5,000 Units Subcutaneous Q8H  . thiamine  100 mg Oral Daily   Continuous Infusions: . sodium chloride Stopped (10/01/20 1059)  . sodium chloride 100 mL/hr at 10/07/20 0846  . cefTRIAXone (ROCEPHIN)  IV 2 g (10/07/20 0954)  . ondansetron (ZOFRAN) IV Stopped (09/30/20 2014)       LOS: 8 days    Time spent: 30 minutes    Pennie Banter, DO [facility]  10/07/2020, 2:35 PM      If 7PM-7AM, please contact night-coverage. How to contact the [facility] Attending or Consulting provider 7A - 7P or covering provider during after hours 7P -7A, for this patient?    1. Check the care team in [facility] and look for a) attending/consulting [facility] provider listed and b) the [facility] team listed 2. Log into [email or web address] and use [place]'s universal password to access. If you do not have the password, please contact the hospital operator. 3. Locate the [facility] provider you are looking for under [facility] and page  to a  number that you can be directly reached. 4. If you still have difficulty reaching the provider, please page the [facility] (Director on Call) for the Hospitalists listed on amion for assistance.

## 2020-10-07 NOTE — Progress Notes (Signed)
   10/07/20 0007  Assess: MEWS Score  Temp (!) 102.7 F (39.3 C)  BP (!) 161/88  Pulse Rate (!) 123  Resp 20  Level of Consciousness Alert  SpO2 92 %  O2 Device Room Air  Assess: MEWS Score  MEWS Temp 2  MEWS Systolic 0  MEWS Pulse 2  MEWS RR 0  MEWS LOC 0  MEWS Score 4  MEWS Score Color Red  Assess: if the MEWS score is Yellow or Red  Were vital signs taken at a resting state? Yes  Focused Assessment No change from prior assessment  Early Detection of Sepsis Score *See Row Information* Medium  MEWS guidelines implemented *See Row Information* Yes  Treat  MEWS Interventions Administered prn meds/treatments  Pain Scale 0-10  Pain Score 7  Pain Type Acute pain  Pain Location Rib cage  Pain Orientation Left  Pain Descriptors / Indicators Aching  Pain Frequency Constant  Pain Onset On-going  Pain Intervention(s) Medication (See eMAR)  Take Vital Signs  Increase Vital Sign Frequency  Red: Q 1hr X 4 then Q 4hr X 4, if remains red, continue Q 4hrs  Escalate  MEWS: Escalate Red: discuss with charge nurse/RN and provider, consider discussing with RRT  Notify: Charge Nurse/RN  Name of Charge Nurse/RN Notified Gladys, RN  Date Charge Nurse/RN Notified 10/07/20  Time Charge Nurse/RN Notified 0007

## 2020-10-07 NOTE — Plan of Care (Signed)
  Problem: Education: Goal: Knowledge of General Education information will improve Description: Including pain rating scale, medication(s)/side effects and non-pharmacologic comfort measures Outcome: Progressing   Problem: Health Behavior/Discharge Planning: Goal: Ability to manage health-related needs will improve Outcome: Progressing   Problem: Clinical Measurements: Goal: Respiratory complications will improve Outcome: Progressing Goal: Cardiovascular complication will be avoided Outcome: Progressing   Problem: Activity: Goal: Risk for activity intolerance will decrease Outcome: Progressing   Problem: Elimination: Goal: Will not experience complications related to urinary retention Outcome: Progressing   Problem: Pain Managment: Goal: General experience of comfort will improve Outcome: Progressing   Problem: Safety: Goal: Ability to remain free from injury will improve Outcome: Progressing   Problem: Skin Integrity: Goal: Risk for impaired skin integrity will decrease Outcome: Progressing   

## 2020-10-07 NOTE — Plan of Care (Signed)
  Problem: Clinical Measurements: Goal: Will remain free from infection Outcome: Progressing   Problem: Education: Goal: Knowledge of General Education information will improve Description: Including pain rating scale, medication(s)/side effects and non-pharmacologic comfort measures Outcome: Progressing   Problem: Health Behavior/Discharge Planning: Goal: Ability to manage health-related needs will improve Outcome: Progressing   Problem: Clinical Measurements: Goal: Ability to maintain clinical measurements within normal limits will improve Outcome: Progressing Goal: Will remain free from infection Outcome: Progressing Goal: Diagnostic test results will improve Outcome: Progressing Goal: Respiratory complications will improve Outcome: Progressing Goal: Cardiovascular complication will be avoided Outcome: Progressing   Problem: Activity: Goal: Risk for activity intolerance will decrease Outcome: Progressing   Problem: Nutrition: Goal: Adequate nutrition will be maintained Outcome: Progressing   Problem: Coping: Goal: Level of anxiety will decrease Outcome: Progressing   Problem: Elimination: Goal: Will not experience complications related to bowel motility Outcome: Progressing Goal: Will not experience complications related to urinary retention Outcome: Progressing   Problem: Pain Managment: Goal: General experience of comfort will improve Outcome: Progressing   Problem: Safety: Goal: Ability to remain free from injury will improve Outcome: Progressing   Problem: Skin Integrity: Goal: Risk for impaired skin integrity will decrease Outcome: Progressing   

## 2020-10-07 NOTE — Progress Notes (Signed)
Pharmacy Antibiotic Note  Dennis [name] is a 30 y.o. male admitted on 09/29/2020 with abdominal pain. Pharmacy has been consulted for Zosyn dosing with concerns for abdominal infection.   Plan: Zosyn EI 3.375g IV q8h  Height: 6\' 1"  (185.4 cm) Weight: 102.1 kg (225 lb 1.4 oz) IBW/kg (Calculated) : 79.9  Temp (24hrs), Avg:99.7 F (37.6 C), Min:98.1 F (36.7 C), Max:102.7 F (39.3 C)  Recent Labs  Lab 10/01/20 0121 10/02/20 0223 10/03/20 0935 10/05/20 0205 10/06/20 0440 10/06/20 1448 10/07/20 0258  WBC 9.6  --  10.4 13.8* 18.0*  --  20.8*  CREATININE 1.01 0.89 0.91 0.93 0.90  --  0.96  LATICACIDVEN  --   --   --   --   --  0.8  --     Estimated Creatinine Clearance: 142.6 mL/min (by C-G formula based on SCr of 0.96 mg/dL).    No Known Allergies  12/07/20, PharmD, BCPS, [facility] Clinical Pharmacist [phone] Please check AMION for all [facility] Pharmacy numbers 10/07/2020

## 2020-10-07 NOTE — Consult Note (Addendum)
Referring Provider:  [facility]         Primary Care Physician:  No primary care provider on file. Primary Gastroenterologist:   Gentry Fitz         We were asked to see this patient for:    pancreatitis              ASSESSMENT / PLAN:   # 30 yo male with acute pancreatitis complicated by peripancreatic fluid collections, possibly evolving pseudocysts. Pancreatitis probably secondary to hypertriglyceridemia, +/- Etoh.  --He will need aggressive management of hypertriglyceridemia.  --No etoh going forward. --Regarding peripancreatic fluid collections there isn't anything to do at the moment. He will need follow up CT scan in a few weeks to reassess fluid collections. If evolve into pseudocysts they may need drainage depending on size and related symptoms.  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 chronicity.  --Consider Hematology referral to determine whether anticoagulation is needed.   # AKI , resolved.      Attending Physician Note   I have taken a history, examined the patient and reviewed the chart. I agree with the Advanced Practitioner's note, impression and recommendations.  * Acute pancreatitis, resolving, likely due to hypertriglyceridemia. CT chest, abd, pelvis today shows 3 fluid collections, likely reflecting evolving pseudocysts. 2 are adjacent to the stomach. They will take weeks to fully organize and need to be monitored as an outpatient to resolution or, if they fail to resolve, to drainage.  Fat modified, heart healthy diet as tolerated  Repeat CT AP as an outpatient in 6 weeks  Outpatient GI follow up in 6 weeks Avoid alcohol use Aggressive mgmt of hypertriglyceridemia long term  * Splenic vein thrombosis with venous collaterals.  Recommend Hematology input to determine need for anticoagulation.   *  Colon wall thickening is likely due to pancreatitis and related fluid collections.  If the colonic changes  due not resolve on follow up CT APs consider colonoscopy as outpatient.   * Possible gallbladder sludge or contrast from prior imaging.  Reassess on repeat CT AP in several weeks.   GI on standby. Will arrange outpatient GI follow up.   Claudette Head, MD [facility] [phone]       HPI:                                                                                                                             Chief Complaint: pancreatitis  Dennis [name] is a 30 y.o. male with a past medical history of THC use, remote seizures, hypertriglyceridemia  Patient presented to [facility] a couple of week ago for evaluation of abdominal pain, nausea and vomiting.  Patient says he was told that he had pancreatitis, was given an unknown medication and discharged home. He felt better for a week except for early satiety which led to a several pound weight loss. He then had recurrent upper  abdominal pain, nausea and vomiting.The pain intermittent,   Throbbing in nature, unrelated to eating. There was radiation of pain into his back.  He was taken by EMS to [facility] where labs suggested pancreatitis. Per H+P his TG were 2423. He was transferred to [facility] on 5/3 for consideration of apheresis. On admission his WBC was elevated and he was in AKI.  His HCT was ~ 45%.  His AKI has resolved. While here he has been tachycardic, febrile and SOB. Chest CT scan today shows LLL consolidation with small to moderate pleural effusion. Pancreatitis. CT abd today demonstrated peripancreatic fluid collections, SVT, small amount of ascites and circumferential wall thickening in distal colon, splenic flexure and proximal descending colon.   Ilia drinks ~ several times a week. He drinks about 4  "For Loko" drinks a week which apparently contains a high percentage of Etoh. This has been his pattern of drinking for a few months. Prior to that he says he didn't drink much at all. He hasn't had any Etoh in the  last month..   History reviewed. No pertinent past medical history.  History reviewed. No pertinent surgical history.  Prior to Admission medications   Medication Sig Start Date End Date Taking? Authorizing Provider  acyclovir (ZOVIRAX) 800 MG tablet Take 800 mg by mouth 5 (five) times daily. For 7 days Patient not taking: Reported on 09/30/2020 09/12/20   [provider]  ondansetron (ZOFRAN-ODT) 4 MG disintegrating tablet Take 4 mg by mouth every 8 (eight) hours as needed for nausea/vomiting. Patient not taking: Reported on 09/30/2020 09/12/20   [provider]  oxyCODONE (OXY IR/ROXICODONE) 5 MG immediate release tablet Take 5 mg by mouth every 6 (six) hours as needed for severe pain. Patient not taking: Reported on 09/30/2020 09/12/20   [provider]    Current Facility-Administered Medications  Medication Dose Route Frequency Provider Last Rate Last Admin  . 0.9 %  sodium chloride infusion   Intravenous PRN Minor, Vilinda Blanks, NP   Stopped at 10/01/20 1059  . 0.9 %  sodium chloride infusion   Intravenous Continuous Esaw Grandchild A, DO 100 mL/hr at 10/07/20 0846 New Bag at 10/07/20 0846  . amLODipine (NORVASC) tablet 10 mg  10 mg Oral Daily Reva Bores, MD   10 mg at 10/07/20 0841  . Chlorhexidine Gluconate Cloth 2 % PADS 6 each  6 each Topical Q0600 Minor, Vilinda Blanks, NP   6 each at 10/07/20 0630  . docusate sodium (COLACE) capsule 100 mg  100 mg Oral BID PRN Minor, Vilinda Blanks, NP      . heparin injection 5,000 Units  5,000 Units Subcutaneous Q8H Minor, Vilinda Blanks, NP   5,000 Units at 10/07/20 1352  . hydrALAZINE (APRESOLINE) injection 10 mg  10 mg Intravenous Q6H PRN Karie Fetch P, DO   10 mg at 10/07/20 0022  . HYDROmorphone (DILAUDID) injection 0.5 mg  0.5 mg Intravenous Q4H PRN Esaw Grandchild A, DO   0.5 mg at 10/07/20 0842  . labetalol (NORMODYNE) injection 20 mg  20 mg Intravenous Q4H PRN Esaw Grandchild A, DO      . LORazepam (ATIVAN) injection 1-2 mg  1-2  mg Intravenous Q1H PRN Minor, Vilinda Blanks, NP      . ondansetron (ZOFRAN) 8 mg in sodium chloride 0.9 % 50 mL IVPB  8 mg Intravenous Q6H PRN Minor, Vilinda Blanks, NP   Stopped at 09/30/20 2014  . oxyCODONE (Oxy IR/ROXICODONE) immediate release tablet 5-10 mg  5-10  mg Oral Q6H PRN Esaw Grandchild A, DO   10 mg at 10/07/20 1353  . polyethylene glycol (MIRALAX / GLYCOLAX) packet 17 g  17 g Oral Daily PRN Minor, Vilinda Blanks, NP      . thiamine tablet 100 mg  100 mg Oral Daily Esaw Grandchild A, DO   100 mg at 10/07/20 0841    Allergies as of 09/29/2020  . (Not on File)    History reviewed. No pertinent family history.  Social History   Socioeconomic History  . Marital status: Single    Spouse name: Not on file  . Number of children: Not on file  . Years of education: Not on file  . Highest education level: Not on file  Occupational History  . Not on file  Tobacco Use  . Smoking status: Not on file  . Smokeless tobacco: Not on file  Substance and Sexual Activity  . Alcohol use: Not on file  . Drug use: Not on file  . Sexual activity: Not on file  Other Topics Concern  . Not on file  Social History Narrative  . Not on file   Social Determinants of Health   Financial Resource Strain: Not on file  Food Insecurity: Not on file  Transportation Needs: Not on file  Physical Activity: Not on file  Stress: Not on file  Social Connections: Not on file  Intimate Partner Violence: Not on file    Review of Systems: All systems reviewed and negative except where noted in HPI.  OBJECTIVE:    Physical Exam: Vital signs in last 24 hours: Temp:  [98.1 F (36.7 C)-102.7 F (39.3 C)] 98.1 F (36.7 C) (05/10 1154) Pulse Rate:  [113-124] 114 (05/10 0755) Resp:  [20-26] 20 (05/10 1154) BP: (146-163)/(84-95) 155/88 (05/10 1154) SpO2:  [91 %-94 %] 94 % (05/10 0755) Weight:  [102.1 kg] 102.1 kg (05/10 0407) Last BM Date: 10/07/20 General:   Alert  male in NAD Psych:  Pleasant, cooperative.  Normal mood and affect. Eyes:  Pupils equal, sclera clear, no icterus.   Conjunctiva pink. Ears:  Normal auditory acuity. Nose:  No deformity, discharge,  or lesions. Neck:  Supple; no masses Lungs:  Clear throughout to auscultation.   No wheezes, crackles, or rhonchi.  Heart:  Regular rate and rhythm; no murmurs, no lower extremity edema Abdomen:  Soft, distended, nontender, BS active, no palp mass   Rectal:  Deferred  Msk:  Symmetrical without gross deformities. . Neurologic:  Alert and  oriented x4;  grossly normal neurologically. Skin:  Intact without significant lesions or rashes. Multiple tatoos  Filed Weights   10/05/20 0500 10/06/20 0500 10/07/20 0407  Weight: 102.1 kg 102.5 kg 102.1 kg     Scheduled inpatient medications . amLODipine  10 mg Oral Daily  . Chlorhexidine Gluconate Cloth  6 each Topical Q0600  . heparin  5,000 Units Subcutaneous Q8H  . thiamine  100 mg Oral Daily      Intake/Output from previous day: 05/09 0701 - 05/10 0700 In: 360 [P.O.:360] Out: 400 [Urine:400] Intake/Output this shift: Total I/O In: 240 [P.O.:240] Out: 375 [Urine:375]   Lab Results: Recent Labs    10/05/20 0205 10/06/20 0440 10/07/20 0258  WBC 13.8* 18.0* 20.8*  HGB 11.1* 11.0* 10.3*  HCT 32.6* 33.1* 31.6*  PLT 162 198 210   BMET Recent Labs    10/05/20 0205 10/06/20 0440 10/07/20 0258  NA 131* 131* 129*  K 3.9 4.1 4.2  CL 100 98 95*  CO2 23 22 22   GLUCOSE 95 112* 105*  BUN 14 15 18   CREATININE 0.93 0.90 0.96  CALCIUM 8.2* 8.1* 8.0*   LFT Recent Labs    10/06/20 0440  PROT 5.6*  ALBUMIN 2.2*  AST 27  ALT 30  ALKPHOS 119  BILITOT 1.2   PT/INR No results for input(s): LABPROT, INR in the last 72 hours. Hepatitis Panel No results for input(s): HEPBSAG, HCVAB, HEPAIGM, HEPBIGM in the last 72 hours.   . CBC Latest Ref Rng & Units 10/07/2020 10/06/2020 10/05/2020  WBC 4.0 - 10.5 K/uL 20.8(H) 18.0(H) 13.8(H)  Hemoglobin 13.0 - 17.0 g/dL 10.3(L) 11.0(L)  11.1(L)  Hematocrit 39.0 - 52.0 % 31.6(L) 33.1(L) 32.6(L)  Platelets 150 - 400 K/uL 210 198 162    . CMP Latest Ref Rng & Units 10/07/2020 10/06/2020 10/05/2020  Glucose 70 - 99 mg/dL 12/06/2020) 12/05/2020) 95  BUN 6 - 20 mg/dL 18 15 14   Creatinine 0.61 - 1.24 mg/dL 481(E 563(J  Sodium 135 - 145 mmol/L 129(L) 131(L) 131(L)  Potassium 3.5 - 5.1 mmol/L 4.2 4.1 3.9  Chloride 98 - 111 mmol/L 95(L) 98 100  CO2 22 - 32 mmol/L 22 22 23   Calcium 8.9 - 10.3 mg/dL 8.0(L) 8.1(L) 8.2(L)  Total Protein 6.5 - 8.1 g/dL - 5.6(L) 5.6(L)  Total Bilirubin 0.3 - 1.2 mg/dL - 1.2 4.97)  Alkaline Phos 38 - 126 U/L - 119 102  AST 15 - 41 U/L - 27 19  ALT 0 - 44 U/L - 30 30   Studies/Results: CT CHEST ABDOMEN PELVIS W CONTRAST  Result Date: 10/07/2020 CLINICAL DATA:  Pancreatitis with persistent left lower lobe pneumonia. EXAM: CT CHEST, ABDOMEN, AND PELVIS WITH CONTRAST TECHNIQUE: Multidetector CT imaging of the chest, abdomen and pelvis was performed following the standard protocol during bolus administration of intravenous contrast. CONTRAST:  31mL OMNIPAQUE IOHEXOL 300 MG/ML  SOLN COMPARISON:  Abdomen/pelvis CT 09/28/2020. FINDINGS: CT CHEST FINDINGS Cardiovascular: The heart size is normal. No substantial pericardial effusion. No thoracic aortic aneurysm. Mediastinum/Nodes: No mediastinal lymphadenopathy. There is no hilar lymphadenopathy. The esophagus has normal imaging features. There is no axillary lymphadenopathy. Lungs/Pleura: Minimal subsegmental atelectasis noted right lower lobe. Left lower lobe collapse/consolidation evident with small to moderate left pleural effusion. Musculoskeletal: No worrisome lytic or sclerotic osseous abnormality. CT ABDOMEN PELVIS FINDINGS Hepatobiliary: No suspicious focal abnormality within the liver parenchyma. Heterogeneous perfusion in the left liver likely accentuated by periportal edema. High attenuation material in the lumen of the gallbladder may be sludge or vicarious  excretion of contrast from prior imaging. Extrahepatic bile ducts not well demonstrated but no definite biliary dilatation. Pancreas: Pancreas is diffusely edematous through the body and tail with no definite non enhancement to suggest pancreatic necrosis 2.1 x 1.8 cm fluid collection in the body of pancreas is new in the interval and may reflect confluent edema or of all vein pseudocyst. There is extensive edema throughout the anterior pararenal space with involvement of the transverse mesocolon, splenic flexure, and proximal descending colon. Spleen: No splenomegaly. No focal mass lesion. Adrenals/Urinary Tract: No adrenal nodule or mass. Kidneys unremarkable. No evidence for hydroureter. The urinary bladder appears normal for the degree of distention. Stomach/Bowel: Stomach is distorted secondary to a large perigastric fluid collection along the greater curvature measuring roughly 12 x 8 x 10 cm. A second perigastric fluid collection is seen along the cranial aspect of the antrum, between the left liver and the gastric antrum measuring approximately 5 x 2 x 8  cm. Duodenum is normally positioned as is the ligament of Treitz. No small bowel wall thickening. No small bowel dilatation. The terminal ileum is normal. The appendix is not well visualized, but there is no edema or inflammation in the region of the cecum. Circumferential wall thickening is noted in the distal transverse colon, splenic flexure, and proximal descending colon. These of the segments surrounded by presumed inflammatory fluid. Sigmoid colon and rectum are unremarkable. Vascular/Lymphatic: No abdominal aortic aneurysm. Celiac axis and SMA are surrounded by edema/inflammation without complicating feature evident. Portal vein is surrounded by edema/inflammation and appears markedly attenuated/narrowed proximally, but does remain patent. SMV appears widely patent. Splenic vein is not visualized consistent with thrombosis. New venous  collateralization is noted in the anterior left abdomen. Small peripancreatic lymph nodes evident with small nodes seen in the hepatoduodenal ligament. No pelvic sidewall lymphadenopathy. Reproductive: No focal mass lesion. No dilatation of the main duct. No intraparenchymal cyst. No peripancreatic edema. Other: Small volume fluid seen adjacent to the inferior liver and inferior spleen. Small volume fluid noted in each paracolic gutter and in the central pelvis. Musculoskeletal: Mild body wall edema evident. No worrisome lytic or sclerotic osseous abnormality. IMPRESSION: 1. Diffuse edema in the body and tail of pancreas with no definite areas of pancreatic necrosis. 2. 2.1 x 1.8 cm fluid collection in the body of pancreas appears to communicate with the peripancreatic fluid/edema. 3. Interval development of large perigastric fluid collection along the greater curvature of the stomach with a second smaller collection along the gastric antrum. While both collections have relatively well-defined margins, neither has a well organized or enhancing rim. Both collections likely reflect evolving pseudocyst. Although no gas is seen in either collection cannot be excluded by CT, superinfection imaging. 4. 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 5. Circumferential wall thickening in the distal transverse colon, splenic flexure, and proximal descending colon. These changes are presumably secondary as these segments of the colon are surrounded by ill-defined fluid attenuation, likely tracking through the transverse mesocolon from the pancreas. 6. Small volume ascites. 7. Left lower lobe collapse/consolidation with small to moderate left pleural effusion. 8. Heterogeneous perfusion in the left liver likely accentuated by periportal edema. 9. High attenuation material in the lumen of the gallbladder may be sludge or vicarious excretion of contrast from prior imaging. Electronically  Signed   By: Kennith CenterEric  Mansell M.D.   On: 10/07/2020 15:18   DG CHEST PORT 1 VIEW  Result Date: 10/06/2020 CLINICAL DATA:  Leukocytosis.  Short of breath on exertion. EXAM: PORTABLE CHEST 1 VIEW COMPARISON:  09/30/2020. FINDINGS: There is consolidation at the left lung base obscuring hemidiaphragm. Mild hazy opacity extends to the left peripheral mid lung. Remainder of the lungs is clear. No right pleural effusion.  No pneumothorax. Heart, mediastinum hila are unremarkable. Skeletal structures are grossly intact. IMPRESSION: 1. Left lower lobe consolidation consistent with pneumonia. Possible associated small effusion. Electronically Signed   By: Amie Portlandavid  Ormond M.D.   On: 10/06/2020 11:17    Principal Problem:   Acute pancreatitis Active Problems:   Hypertriglyceridemia   AKI (acute kidney injury) (HCC)   Leukocytosis    Willette ClusterPaula Guenther, NP-C @  10/07/2020, 3:53 PM

## 2020-10-08 DIAGNOSIS — K8500 Idiopathic acute pancreatitis without necrosis or infection: Secondary | ICD-10-CM

## 2020-10-08 LAB — BASIC METABOLIC PANEL
Anion gap: 11 (ref 5–15)
BUN: 18 mg/dL (ref 6–20)
CO2: 25 mmol/L (ref 22–32)
Calcium: 8 mg/dL — ABNORMAL LOW (ref 8.9–10.3)
Chloride: 95 mmol/L — ABNORMAL LOW (ref 98–111)
Creatinine, Ser: 0.94 mg/dL (ref 0.61–1.24)
GFR, Estimated: >60 mL/min (ref >60)
Glucose, Bld: 108 mg/dL — ABNORMAL HIGH (ref 70–99)
Potassium: 3.8 mmol/L (ref 3.5–5.1)
Sodium: 131 mmol/L — ABNORMAL LOW (ref 135–145)

## 2020-10-08 LAB — CBC WITH DIFFERENTIAL/PLATELET
Abs Immature Granulocytes: 0 10*3/uL (ref 0.00–0.07)
Basophils Absolute: 0 10*3/uL (ref 0.0–0.1)
Basophils Relative: 0 %
Eosinophils Absolute: 0.2 10*3/uL (ref 0.0–0.5)
Eosinophils Relative: 1 %
HCT: 25.3 % — ABNORMAL LOW (ref 39.0–52.0)
Hemoglobin: 8.4 g/dL — ABNORMAL LOW (ref 13.0–17.0)
Lymphocytes Relative: 8 %
Lymphs Abs: 1.4 10*3/uL (ref 0.7–4.0)
MCH: 32.1 pg (ref 26.0–34.0)
MCHC: 33.2 g/dL (ref 30.0–36.0)
MCV: 96.6 fL (ref 80.0–100.0)
Monocytes Absolute: 0.7 10*3/uL (ref 0.1–1.0)
Monocytes Relative: 4 %
Neutro Abs: 15.5 10*3/uL — ABNORMAL HIGH (ref 1.7–7.7)
Neutrophils Relative %: 87 %
Platelets: 208 10*3/uL (ref 150–400)
RBC: 2.62 MIL/uL — ABNORMAL LOW (ref 4.22–5.81)
RDW: 14.5 % (ref 11.5–15.5)
WBC: 17.8 10*3/uL — ABNORMAL HIGH (ref 4.0–10.5)
nRBC: 0 % (ref 0.0–0.2)
nRBC: 0 /100 WBC

## 2020-10-08 LAB — LIPASE, BLOOD: Lipase: 69 U/L — ABNORMAL HIGH (ref 11–51)

## 2020-10-08 LAB — LEGIONELLA PNEUMOPHILA SEROGP 1 UR AG: L. pneumophila Serogp 1 Ur Ag: NEGATIVE

## 2020-10-08 LAB — PROCALCITONIN: Procalcitonin: 1.25 ng/mL

## 2020-10-08 MED ORDER — FENOFIBRATE 54 MG PO TABS
54.0000 mg | ORAL_TABLET | Freq: Every day | ORAL | Status: DC
  Administered 2020-10-09 – 2020-10-13 (×5): 54 mg via ORAL
  Filled 2020-10-08 (×7): qty 1

## 2020-10-08 MED ORDER — ACETAMINOPHEN 325 MG PO TABS
650.0000 mg | ORAL_TABLET | ORAL | Status: DC | PRN
  Administered 2020-10-09 – 2020-10-12 (×10): 650 mg via ORAL
  Filled 2020-10-08 (×10): qty 2

## 2020-10-08 NOTE — Progress Notes (Signed)
PROGRESS NOTE    Dennis [name]   ZOX:[id number]  DOB: 1990/12/18  PCP: No primary care provider on file.    DOA: 09/29/2020 LOS: 9   Brief Narrative   30 year old male with history of EtOH, tobacco and marijuana use, remote seizures came into the hospital with abdominal pain and diagnosed with acute pancreatitis.  2 weeks ago he was admitted to [facility] ED, diagnosed with alcoholic pancreatitis and discharged home.  He has not had any alcohol since.  His abdominal pain recurred and came back to the hospital, he was found to have triglycerides of 2400 and was sent to [facility], ICU to be evaluated for possible Plex for hypertriglyceridemia.  Symptomatically improved with insulin and dextrose infusion.  Remains in the hospital with persistent pain, development of pseudocyst. Patient also developed left lower lobe pneumonia.    Assessment & Plan   Principal Problem:   Acute pancreatitis Active Problems:   Hypertriglyceridemia   AKI (acute kidney injury) (HCC)   Leukocytosis   Abnormal CT of the abdomen  Acute pancreatitis due to EtOH and hypertriglyceridemia - initially admitted to ICU, treated with insulin and dextrose infusion, now off.  TG's now 295 (less than 500 since 5/4). --Supportive care: IVF's (reduce to 74ml/hr), pain control --Soft diet.  Patient tolerating about 50% of the meal. --CT chest/abdomen/pelvis with multiple pancreatic cyst, 10 cm pseudocyst with some compression on the stomach.  Seen by gastroenterology, suggested symptomatic treatment and 6 weeks follow-up. -- Start on low-dose fenofibrate, earlier doses stop due to abdominal pain which could be related to primary symptom.  Left lower lobe pneumonia - CXR obtained 5/9 due to worsened leukocytosis and diminished lung bases on exam, showed LLL infiltrate and small pleural effusion.  CT scan with left lower lobe collapse/consolidation with some pleural effusion. --On different antibiotics.  Currently on  Zosyn that we will continue. --Mobility and aggressive chest physiotherapy. --If persistent fever, will send for left-sided thoracentesis tomorrow.  Leukocytosis and procalcitonin improving.  Sepsis due to Pneumonia vs SIRS from pancreatitis. With leukocytosis, fever, tachycardia PNA identified on CXR 5/9 but was not excluded on xray of 5/3 with bibasilar atelectasis vs infiltrate. --Sepsis physiology improving --Abx as above  Hyponatremia - Na 131. Fluids had expired and poor PO intake.  Resumed fluids.  BMP in AM.  Legionella antigen negative.   AKI Hypomagnesemia Hypokalema Hypocalcemia Non-anion gap metabolic acidosis Aggressively replaced and normalized.  Hypertension - continue amlodipine.  Stable.  Polysubstance abuse - motivated and determined to quit.  Last drink of EtOH now 4 weeks ago.   Patient BMI: Body mass index is 29.9 kg/m.   DVT prophylaxis: heparin injection 5,000 Units Start: 09/30/20 0600 SCDs Start: 09/30/20 0121   Diet:  Diet Orders (From admission, onward)    Start     Ordered   10/07/20 1621  DIET SOFT Room service appropriate? Yes; Fluid consistency: Thin  Diet effective now       Question Answer Comment  Room service appropriate? Yes   Fluid consistency: Thin      10/07/20 1620            Code Status: Full Code    Subjective 10/08/20   Patient seen and examined.  Overnight had some distention.  When he ate some food last night, he felt early satiety and some abdominal discomfort but no vomiting.  Had a small liquid bowel movement yesterday. Last 24 hours, temperature 100 F.  He walked around the hallway with some  shortness of breath.  No sputum production.   Disposition Plan & Communication   Status is: Inpatient  Remains inpatient appropriate because:IV treatments appropriate due to intensity of illness or inability to take PO   Dispo: The patient is from: Home              Anticipated d/c is to: Home              Patient  currently is not medically stable to d/c.   Difficult to place patient No    Consults, Procedures, Significant Events   Consultants:   Gastroenterology  Procedures:   None  Antimicrobials:  Anti-infectives (From admission, onward)   Start     Dose/Rate Route Frequency Ordered Stop   10/07/20 1645  piperacillin-tazobactam (ZOSYN) IVPB 3.375 g        3.375 g 12.5 mL/hr over 240 Minutes Intravenous Every 8 hours 10/07/20 1557     10/07/20 1000  azithromycin (ZITHROMAX) tablet 500 mg  Status:  Discontinued        500 mg Oral Daily 10/07/20 0749 10/07/20 1542   10/07/20 0845  cefTRIAXone (ROCEPHIN) 2 g in sodium chloride 0.9 % 100 mL IVPB  Status:  Discontinued        2 g 200 mL/hr over 30 Minutes Intravenous Every 24 hours 10/07/20 0749 10/07/20 1542   10/06/20 1315  amoxicillin-clavulanate (AUGMENTIN) 875-125 MG per tablet 1 tablet  Status:  Discontinued        1 tablet Oral Every 12 hours 10/06/20 1227 10/07/20 0749        Micro  No positive data yet.  Objective   Vitals:   10/07/20 2319 10/08/20 0339 10/08/20 0500 10/08/20 0801  BP: (!) 147/80 133/80  139/87  Pulse: (!) 108 (!) 110  (!) 114  Resp: 20 20  18   Temp: 100.2 F (37.9 C) 99.4 F (37.4 C)  (!) 100.8 F (38.2 C)  TempSrc: Oral Oral  Oral  SpO2: 95% 93%  93%  Weight:   102.8 kg   Height:        Intake/Output Summary (Last 24 hours) at 10/08/2020 0820 Last data filed at 10/08/2020 0339 Gross per 24 hour  Intake 1905.73 ml  Output 1375 ml  Net 530.73 ml   Filed Weights   10/06/20 0500 10/07/20 0407 10/08/20 0500  Weight: 102.5 kg 102.1 kg 102.8 kg    General: Looks comfortable on room air. Cardiovascular: S1-S2 normal.  No murmurs. Respiratory: Mostly bilateral clear.  Decreased excursion and breathing sound on the left base. Gastrointestinal: Soft.  Distended and mildly tender all over.  Bowel sounds present.  No rigidity or guarding. Ext: No edema.  No cyanosis. Neuro: Alert oriented x4.   Equal strength in all extremities.  Labs   Data Reviewed: I have personally reviewed following labs and imaging studies  CBC: Recent Labs  Lab 10/03/20 0935 10/05/20 0205 10/06/20 0440 10/07/20 0258 10/08/20 0441  WBC 10.4 13.8* 18.0* 20.8* 17.8*  NEUTROABS  --   --   --   --  15.5*  HGB 11.8* 11.1* 11.0* 10.3* 8.4*  HCT 35.1* 32.6* 33.1* 31.6* 25.3*  MCV 96.7 96.2 96.2 97.5 96.6  PLT 143* 162 198 210 208   Basic Metabolic Panel: Recent Labs  Lab 10/03/20 0935 10/05/20 0205 10/06/20 0440 10/07/20 0258 10/08/20 0441  NA 131* 131* 131* 129* 131*  K 4.3 3.9 4.1 4.2 3.8  CL 96* 100 98 95* 95*  CO2 27 23 22  22  25  GLUCOSE 125* 95 112* 105* 108*  BUN 12 14 15 18 18   CREATININE 0.91 0.93 0.90 0.96 0.94  CALCIUM 8.4* 8.2* 8.1* 8.0* 8.0*  MG 2.0 1.9  --  1.9  --   PHOS  --  3.9  --   --   --    GFR: Estimated Creatinine Clearance: 146.1 mL/min (by C-G formula based on SCr of 0.94 mg/dL). Liver Function Tests: Recent Labs  Lab 10/03/20 0935 10/05/20 0205 10/06/20 0440  AST 28 19 27   ALT 41 30 30  ALKPHOS 106 102 119  BILITOT 2.1* 1.3* 1.2  PROT 6.0* 5.6* 5.6*  ALBUMIN 2.4* 2.2* 2.2*   Recent Labs  Lab 10/05/20 0205 10/08/20 0441  LIPASE 43 69*   No results for input(s): AMMONIA in the last 168 hours. Coagulation Profile: No results for input(s): INR, PROTIME in the last 168 hours. Cardiac Enzymes: No results for input(s): CKTOTAL, CKMB, CKMBINDEX, TROPONINI in the last 168 hours. BNP (last 3 results) No results for input(s): PROBNP in the last 8760 hours. HbA1C: No results for input(s): HGBA1C in the last 72 hours. CBG: Recent Labs  Lab 10/05/20 0523 10/05/20 0947 10/05/20 1244 10/05/20 1959 10/06/20 0331  GLUCAP 99 115* 124* 107* 114*   Lipid Profile: Recent Labs    10/07/20 0258  TRIG 255*   Thyroid Function Tests: No results for input(s): TSH, T4TOTAL, FREET4, T3FREE, THYROIDAB in the last 72 hours. Anemia Panel: No results for  input(s): VITAMINB12, FOLATE, FERRITIN, TIBC, IRON, RETICCTPCT in the last 72 hours. Sepsis Labs: Recent Labs  Lab 10/06/20 1448 10/07/20 0924  PROCALCITON 1.22 1.31  LATICACIDVEN 0.8  --     Recent Results (from the past 240 hour(s))  MRSA PCR Screening     Status: None   Collection Time: 09/29/20 11:07 PM   Specimen: Nasal Mucosa; Nasopharyngeal  Result Value Ref Range Status   MRSA by PCR NEGATIVE NEGATIVE Final    Comment:        The GeneXpert MRSA Assay (FDA approved for NASAL specimens only), is one component of a comprehensive MRSA colonization surveillance program. It is not intended to diagnose MRSA infection nor to guide or monitor treatment for MRSA infections. Performed at [facility], [address] [place], [address] [place]   Culture, blood (routine x 2) Call MD if unable to obtain prior to antibiotics being given     Status: None (Preliminary result)   Collection Time: 10/07/20  9:24 AM   Specimen: BLOOD  Result Value Ref Range Status   Specimen Description BLOOD RIGHT ANTECUBITAL  Final   Special Requests   Final    BOTTLES DRAWN AEROBIC AND ANAEROBIC Blood Culture results may not be optimal due to an inadequate volume of blood received in culture bottles   Culture   Final    NO GROWTH < 24 HOURS Performed at [facility], [address], [place], [address] [place]    Report Status PENDING  Incomplete  Culture, blood (routine x 2) Call MD if unable to obtain prior to antibiotics being given     Status: None (Preliminary result)   Collection Time: 10/07/20  9:24 AM   Specimen: BLOOD RIGHT FOREARM  Result Value Ref Range Status   Specimen Description BLOOD RIGHT FOREARM  Final   Special Requests   Final    BOTTLES DRAWN AEROBIC AND ANAEROBIC Blood Culture results may not be optimal due to an inadequate volume of blood received  in culture bottles   Culture   Final    NO GROWTH < 24 HOURS Performed at [facility], [address] [place], [place] [address]    Report Status PENDING  Incomplete      Imaging Studies   CT CHEST ABDOMEN PELVIS W CONTRAST  Result Date: 10/07/2020 CLINICAL DATA:  Pancreatitis with persistent left lower lobe pneumonia. EXAM: CT CHEST, ABDOMEN, AND PELVIS WITH CONTRAST TECHNIQUE: Multidetector CT imaging of the chest, abdomen and pelvis was performed following the standard protocol during bolus administration of intravenous contrast. CONTRAST:  75mL OMNIPAQUE IOHEXOL 300 MG/ML  SOLN COMPARISON:  Abdomen/pelvis CT 09/28/2020. FINDINGS: CT CHEST FINDINGS Cardiovascular: The heart size is normal. No substantial pericardial effusion. No thoracic aortic aneurysm. Mediastinum/Nodes: No mediastinal lymphadenopathy. There is no hilar lymphadenopathy. The esophagus has normal imaging features. There is no axillary lymphadenopathy. Lungs/Pleura: Minimal subsegmental atelectasis noted right lower lobe. Left lower lobe collapse/consolidation evident with small to moderate left pleural effusion. Musculoskeletal: No worrisome lytic or sclerotic osseous abnormality. CT ABDOMEN PELVIS FINDINGS Hepatobiliary: No suspicious focal abnormality within the liver parenchyma. Heterogeneous perfusion in the left liver likely accentuated by periportal edema. High attenuation material in the lumen of the gallbladder may be sludge or vicarious excretion of contrast from prior imaging. Extrahepatic bile ducts not well demonstrated but no definite biliary dilatation. Pancreas: Pancreas is diffusely edematous through the body and tail with no definite non enhancement to suggest pancreatic necrosis 2.1 x 1.8 cm fluid collection in the body of pancreas is new in the interval and may reflect confluent edema or of all vein pseudocyst. There is extensive edema throughout the anterior pararenal space with involvement of the transverse mesocolon, splenic flexure, and proximal descending colon. Spleen: No splenomegaly. No focal mass lesion.  Adrenals/Urinary Tract: No adrenal nodule or mass. Kidneys unremarkable. No evidence for hydroureter. The urinary bladder appears normal for the degree of distention. Stomach/Bowel: Stomach is distorted secondary to a large perigastric fluid collection along the greater curvature measuring roughly 12 x 8 x 10 cm. A second perigastric fluid collection is seen along the cranial aspect of the antrum, between the left liver and the gastric antrum measuring approximately 5 x 2 x 8 cm. Duodenum is normally positioned as is the ligament of Treitz. No small bowel wall thickening. No small bowel dilatation. The terminal ileum is normal. The appendix is not well visualized, but there is no edema or inflammation in the region of the cecum. Circumferential wall thickening is noted in the distal transverse colon, splenic flexure, and proximal descending colon. These of the segments surrounded by presumed inflammatory fluid. Sigmoid colon and rectum are unremarkable. Vascular/Lymphatic: No abdominal aortic aneurysm. Celiac axis and SMA are surrounded by edema/inflammation without complicating feature evident. Portal vein is surrounded by edema/inflammation and appears markedly attenuated/narrowed proximally, but does remain patent. SMV appears widely patent. Splenic vein is not visualized consistent with thrombosis. New venous collateralization is noted in the anterior left abdomen. Small peripancreatic lymph nodes evident with small nodes seen in the hepatoduodenal ligament. No pelvic sidewall lymphadenopathy. Reproductive: No focal mass lesion. No dilatation of the main duct. No intraparenchymal cyst. No peripancreatic edema. Other: Small volume fluid seen adjacent to the inferior liver and inferior spleen. Small volume fluid noted in each paracolic gutter and in the central pelvis. Musculoskeletal: Mild body wall edema evident. No worrisome lytic or sclerotic osseous abnormality. IMPRESSION: 1. Diffuse edema in the body and  tail of pancreas with no definite areas  of pancreatic necrosis. 2. 2.1 x 1.8 cm fluid collection in the body of pancreas appears to communicate with the peripancreatic fluid/edema. 3. Interval development of large perigastric fluid collection along the greater curvature of the stomach with a second smaller collection along the gastric antrum. While both collections have relatively well-defined margins, neither has a well organized or enhancing rim. Both collections likely reflect evolving pseudocyst. Although no gas is seen in either collection cannot be excluded by CT, superinfection imaging. 4. 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 5. Circumferential wall thickening in the distal transverse colon, splenic flexure, and proximal descending colon. These changes are presumably secondary as these segments of the colon are surrounded by ill-defined fluid attenuation, likely tracking through the transverse mesocolon from the pancreas. 6. Small volume ascites. 7. Left lower lobe collapse/consolidation with small to moderate left pleural effusion. 8. Heterogeneous perfusion in the left liver likely accentuated by periportal edema. 9. High attenuation material in the lumen of the gallbladder may be sludge or vicarious excretion of contrast from prior imaging. Electronically Signed   By: Kennith Center M.D.   On: 10/07/2020 15:18   DG CHEST PORT 1 VIEW  Result Date: 10/06/2020 CLINICAL DATA:  Leukocytosis.  Short of breath on exertion. EXAM: PORTABLE CHEST 1 VIEW COMPARISON:  09/30/2020. FINDINGS: There is consolidation at the left lung base obscuring hemidiaphragm. Mild hazy opacity extends to the left peripheral mid lung. Remainder of the lungs is clear. No right pleural effusion.  No pneumothorax. Heart, mediastinum hila are unremarkable. Skeletal structures are grossly intact. IMPRESSION: 1. Left lower lobe consolidation consistent with pneumonia. Possible associated small  effusion. Electronically Signed   By: Amie Portland M.D.   On: 10/06/2020 11:17     Medications   Scheduled Meds: . amLODipine  10 mg Oral Daily  . Chlorhexidine Gluconate Cloth  6 each Topical Q0600  . fenofibrate  54 mg Oral Daily  . heparin  5,000 Units Subcutaneous Q8H  . thiamine  100 mg Oral Daily   Continuous Infusions: . sodium chloride Stopped (10/01/20 1059)  . sodium chloride 100 mL/hr at 10/08/20 0559  . ondansetron (ZOFRAN) IV Stopped (09/30/20 2014)  . piperacillin-tazobactam (ZOSYN)  IV 3.375 g (10/08/20 0025)       LOS: 9 days    Time spent: 30 minutes    Dorcas Carrow, MD [facility]  10/08/2020, 8:20 AM

## 2020-10-09 ENCOUNTER — Inpatient Hospital Stay (HOSPITAL_COMMUNITY): Payer: Medicaid Other | Admitting: Anesthesiology

## 2020-10-09 ENCOUNTER — Encounter (HOSPITAL_COMMUNITY): Payer: Self-pay | Admitting: Pulmonary Disease

## 2020-10-09 ENCOUNTER — Encounter (HOSPITAL_COMMUNITY): Admission: AD | Disposition: A | Discharge status: Home / Self Care | Payer: Self-pay | Attending: Internal Medicine

## 2020-10-09 DIAGNOSIS — I8500 Esophageal varices without bleeding: Secondary | ICD-10-CM

## 2020-10-09 DIAGNOSIS — K269 Duodenal ulcer, unspecified as acute or chronic, without hemorrhage or perforation: Secondary | ICD-10-CM

## 2020-10-09 DIAGNOSIS — D62 Acute posthemorrhagic anemia: Secondary | ICD-10-CM

## 2020-10-09 DIAGNOSIS — K921 Melena: Secondary | ICD-10-CM

## 2020-10-09 HISTORY — PX: ESOPHAGOGASTRODUODENOSCOPY (EGD) WITH PROPOFOL: SHX5813

## 2020-10-09 HISTORY — PX: BIOPSY: SHX5522

## 2020-10-09 HISTORY — PX: ESOPHAGEAL BANDING: SHX5518

## 2020-10-09 LAB — PREPARE RBC (CROSSMATCH)

## 2020-10-09 LAB — CBC WITH DIFFERENTIAL/PLATELET
Abs Immature Granulocytes: 0.8 10*3/uL — ABNORMAL HIGH (ref 0.00–0.07)
Basophils Absolute: 0 10*3/uL (ref 0.0–0.1)
Basophils Relative: 0 %
Eosinophils Absolute: 0.2 10*3/uL (ref 0.0–0.5)
Eosinophils Relative: 1 %
HCT: 19.2 % — ABNORMAL LOW (ref 39.0–52.0)
Hemoglobin: 6.3 g/dL — CL (ref 13.0–17.0)
Lymphocytes Relative: 4 %
Lymphs Abs: 0.6 10*3/uL — ABNORMAL LOW (ref 0.7–4.0)
MCH: 32 pg (ref 26.0–34.0)
MCHC: 32.8 g/dL (ref 30.0–36.0)
MCV: 97.5 fL (ref 80.0–100.0)
Metamyelocytes Relative: 3 %
Monocytes Absolute: 0.9 10*3/uL (ref 0.1–1.0)
Monocytes Relative: 6 %
Myelocytes: 2 %
Neutro Abs: 13 10*3/uL — ABNORMAL HIGH (ref 1.7–7.7)
Neutrophils Relative %: 84 %
Platelets: 213 10*3/uL (ref 150–400)
RBC: 1.97 MIL/uL — ABNORMAL LOW (ref 4.22–5.81)
RDW: 14.5 % (ref 11.5–15.5)
WBC: 15.5 10*3/uL — ABNORMAL HIGH (ref 4.0–10.5)
nRBC: 0.2 % (ref 0.0–0.2)
nRBC: 1 /100 WBC — ABNORMAL HIGH

## 2020-10-09 LAB — RESP PANEL BY RT-PCR (FLU A&B, COVID) ARPGX2
Influenza A by PCR: NEGATIVE
Influenza B by PCR: NEGATIVE
SARS Coronavirus 2 by RT PCR: NEGATIVE

## 2020-10-09 LAB — RETICULOCYTES
Immature Retic Fract: 43.5 % — ABNORMAL HIGH (ref 2.3–15.9)
RBC.: 1.98 MIL/uL — ABNORMAL LOW (ref 4.22–5.81)
Retic Count, Absolute: 41.2 10*3/uL (ref 19.0–186.0)
Retic Ct Pct: 2.1 % (ref 0.4–3.1)

## 2020-10-09 LAB — IRON AND TIBC
Iron: 38 ug/dL — ABNORMAL LOW (ref 45–182)
Saturation Ratios: 18 % (ref 17.9–39.5)
TIBC: 209 ug/dL — ABNORMAL LOW (ref 250–450)
UIBC: 171 ug/dL

## 2020-10-09 LAB — COMPREHENSIVE METABOLIC PANEL
ALT: 35 U/L (ref 0–44)
AST: 36 U/L (ref 15–41)
Albumin: 1.9 g/dL — ABNORMAL LOW (ref 3.5–5.0)
Alkaline Phosphatase: 112 U/L (ref 38–126)
Anion gap: 9 (ref 5–15)
BUN: 26 mg/dL — ABNORMAL HIGH (ref 6–20)
CO2: 25 mmol/L (ref 22–32)
Calcium: 7.9 mg/dL — ABNORMAL LOW (ref 8.9–10.3)
Chloride: 96 mmol/L — ABNORMAL LOW (ref 98–111)
Creatinine, Ser: 0.97 mg/dL (ref 0.61–1.24)
GFR, Estimated: >60 mL/min (ref >60)
Glucose, Bld: 138 mg/dL — ABNORMAL HIGH (ref 70–99)
Potassium: 3.8 mmol/L (ref 3.5–5.1)
Sodium: 130 mmol/L — ABNORMAL LOW (ref 135–145)
Total Bilirubin: 0.4 mg/dL (ref 0.3–1.2)
Total Protein: 5.3 g/dL — ABNORMAL LOW (ref 6.5–8.1)

## 2020-10-09 LAB — OCCULT BLOOD X 1 CARD TO LAB, STOOL: Fecal Occult Bld: POSITIVE — AB

## 2020-10-09 LAB — FOLATE: Folate: 6.7 ng/mL (ref >5.9)

## 2020-10-09 LAB — POCT I-STAT, CHEM 8
BUN: 18 mg/dL (ref 6–20)
Calcium, Ion: 1 mmol/L — ABNORMAL LOW (ref 1.15–1.40)
Chloride: 98 mmol/L (ref 98–111)
Creatinine, Ser: 0.8 mg/dL (ref 0.61–1.24)
Glucose, Bld: 103 mg/dL — ABNORMAL HIGH (ref 70–99)
HCT: 23 % — ABNORMAL LOW (ref 39.0–52.0)
Hemoglobin: 7.8 g/dL — ABNORMAL LOW (ref 13.0–17.0)
Potassium: 3.9 mmol/L (ref 3.5–5.1)
Sodium: 131 mmol/L — ABNORMAL LOW (ref 135–145)
TCO2: 24 mmol/L (ref 22–32)

## 2020-10-09 LAB — PROCALCITONIN: Procalcitonin: 1.29 ng/mL

## 2020-10-09 LAB — FERRITIN: Ferritin: 1806 ng/mL — ABNORMAL HIGH (ref 24–336)

## 2020-10-09 LAB — HEMOGLOBIN AND HEMATOCRIT, BLOOD
HCT: 18.9 % — ABNORMAL LOW (ref 39.0–52.0)
Hemoglobin: 6.2 g/dL — CL (ref 13.0–17.0)

## 2020-10-09 LAB — VITAMIN B12: Vitamin B-12: 822 pg/mL (ref 180–914)

## 2020-10-09 LAB — PHOSPHORUS: Phosphorus: 3.8 mg/dL (ref 2.5–4.6)

## 2020-10-09 LAB — ABO/RH: ABO/RH(D): O POS

## 2020-10-09 LAB — MAGNESIUM: Magnesium: 2 mg/dL (ref 1.7–2.4)

## 2020-10-09 SURGERY — ESOPHAGOGASTRODUODENOSCOPY (EGD) WITH PROPOFOL
Anesthesia: General

## 2020-10-09 MED ORDER — LIDOCAINE 2% (20 MG/ML) 5 ML SYRINGE
INTRAMUSCULAR | Status: DC | PRN
  Administered 2020-10-09: 60 mg via INTRAVENOUS

## 2020-10-09 MED ORDER — ONDANSETRON HCL 4 MG/2ML IJ SOLN
INTRAMUSCULAR | Status: DC | PRN
  Administered 2020-10-09: 4 mg via INTRAVENOUS

## 2020-10-09 MED ORDER — IPRATROPIUM-ALBUTEROL 0.5-2.5 (3) MG/3ML IN SOLN
3.0000 mL | Freq: Once | RESPIRATORY_TRACT | Status: AC
  Administered 2020-10-09: 3 mL via RESPIRATORY_TRACT

## 2020-10-09 MED ORDER — PROPOFOL 10 MG/ML IV BOLUS
INTRAVENOUS | Status: DC | PRN
  Administered 2020-10-09 (×2): 20 mg via INTRAVENOUS
  Administered 2020-10-09: 70 mg via INTRAVENOUS

## 2020-10-09 MED ORDER — SODIUM CHLORIDE 0.9% IV SOLUTION: Freq: Once | INTRAVENOUS | Status: AC

## 2020-10-09 MED ORDER — ONDANSETRON HCL 4 MG/2ML IJ SOLN: 4.0000 mg | Freq: Once | INTRAMUSCULAR | Status: DC | PRN

## 2020-10-09 MED ORDER — SODIUM CHLORIDE 0.9 % IV SOLN
50.0000 ug/h | INTRAVENOUS | Status: DC
  Administered 2020-10-09 – 2020-10-13 (×9): 50 ug/h via INTRAVENOUS
  Filled 2020-10-09 (×12): qty 1

## 2020-10-09 MED ORDER — PROPOFOL 500 MG/50ML IV EMUL
INTRAVENOUS | Status: DC | PRN
  Administered 2020-10-09: 175 ug/kg/min via INTRAVENOUS

## 2020-10-09 MED ORDER — PANTOPRAZOLE SODIUM 40 MG IV SOLR
40.0000 mg | Freq: Two times a day (BID) | INTRAVENOUS | Status: DC
  Administered 2020-10-09 – 2020-10-12 (×8): 40 mg via INTRAVENOUS
  Filled 2020-10-09 (×8): qty 40

## 2020-10-09 MED ORDER — OCTREOTIDE LOAD VIA INFUSION
50.0000 ug | Freq: Once | INTRAVENOUS | Status: AC
  Administered 2020-10-09: 50 ug via INTRAVENOUS
  Filled 2020-10-09: qty 25

## 2020-10-09 MED ORDER — SUCCINYLCHOLINE CHLORIDE 20 MG/ML IJ SOLN
INTRAMUSCULAR | Status: DC | PRN
  Administered 2020-10-09: 100 mg via INTRAVENOUS

## 2020-10-09 MED ORDER — FENTANYL CITRATE (PF) 100 MCG/2ML IJ SOLN: 25.0000 ug | INTRAMUSCULAR | Status: DC | PRN

## 2020-10-09 MED ORDER — SODIUM CHLORIDE 0.9% IV SOLUTION: Freq: Once | INTRAVENOUS | Status: DC

## 2020-10-09 MED ORDER — SODIUM CHLORIDE 0.9 % IV SOLN: INTRAVENOUS | Status: DC

## 2020-10-09 MED ORDER — IPRATROPIUM-ALBUTEROL 0.5-2.5 (3) MG/3ML IN SOLN
RESPIRATORY_TRACT | Status: AC
  Filled 2020-10-09: qty 3

## 2020-10-09 SURGICAL SUPPLY — 15 items

## 2020-10-09 NOTE — Op Note (Addendum)
[facility] Patient Name: Dennis [name] Procedure Date : 10/09/2020 MRN: [name] Attending MD: Meryl DareMalcolm T Dennis [name] , MD Date of Birth: 12/04/1990 CSN: [id number] Age: [age] Admit Type: Inpatient Procedure:                Upper GI endoscopy Indications:              Acute post hemorrhagic anemia, Melena Providers:                Venita LickMalcolm T. Russella DarStark, MD, Estella HuskJarmila Fucs RN, RN, Arlee Muslimhris                            Chandler Tech., Technician Referring MD:             [facility] Medicines:                General Anesthesia Complications:            No immediate complications. Estimated Blood Loss:     Estimated blood loss was minimal. Procedure:                Pre-Anesthesia Assessment:                           - Prior to the procedure, a History and Physical                            was performed, and patient medications and                            allergies were reviewed. The patient's tolerance of                            previous anesthesia was also reviewed. The risks                            and benefits of the procedure and the sedation                            options and risks were discussed with the patient.                            All questions were answered, and informed consent                            was obtained. Prior Anticoagulants: The patient has                            taken no previous anticoagulant or antiplatelet                            agents. ASA Grade Assessment: II - A patient with                            mild systemic disease. After reviewing the risks  and benefits, the patient was deemed in                            satisfactory condition to undergo the procedure.                           After obtaining informed consent, the endoscope was                            passed under direct vision. Throughout the                            procedure, the patient's blood pressure, pulse, and                             oxygen saturations were monitored continuously. The                            GIF-H190 ([id number]) Olympus gastroscope was                            introduced through the mouth, and advanced to the                            second part of duodenum. The upper GI endoscopy was                            accomplished without difficulty. The patient                            tolerated the procedure well. Scope In: Scope Out: Findings:      Grade II varices were found in the distal esophagus. They were 5 mm in       largest diameter. Three bands were successfully placed with complete       eradication, resulting in deflation of varices. There was no bleeding       during and at the end of the procedure.      The exam of the esophagus was otherwise normal.      Extrinsic compression on the stomach was found in the gastric fundus and       gastric body on the greater curvature, posterior wall.      Extrinsic compression on the stomach was found on the lesser curvature       of the gastric antrum.      The exam of the stomach was otherwise normal.      A few localized erosions, with edematous, inflammed and friable mucosa,       were found in the duodenal bulb. This could be related to pancreatitis.       Biopsies were taken with a cold forceps for histology.      The exam of the duodenum was otherwise normal. Impression:               - Grade II esophageal varices. Completely  eradicated. Banded.                           - Extrinsic compression in the gastric fundus and                            in the gastric body (greater curvature, posterior                            wall).                           - Extrinsic compression in the gastric antrum                            (lesser curvature).                           - Duodenal erosions. Biopsied. Recommendation:           - Return patient to hospital ward for ongoing care.                           -  Clear liquid diet today.                           - Source of bleed not clear - either friable,                            erosive duodentitis or esophageal varices.                           - Extrinsic compression in the fundus is severe and                            could interfere with normal food intake.                           - Continue present medications including                            pantoprazole 40 mg bid.                           - Administer IV octreotide bolus and then infusion                            of 50 micrograms per hour for 3 days.                           - Await pathology results.                           - No aspirin, ibuprofen, naproxen, or other                            non-steroidal anti-inflammatory  drugs.                           - Repeat upper endoscopy about 3-6 weeks for                            endoscopic band ligation. Procedure Code(s):        --- Professional ---                           [phone], Esophagogastroduodenoscopy, flexible,                            transoral; with band ligation of esophageal/gastric                            varices                           43239, Esophagogastroduodenoscopy, flexible,                            transoral; with biopsy, single or multiple Diagnosis Code(s):        --- Professional ---                           I85.00, Esophageal varices without bleeding                           K31.89, Other diseases of stomach and duodenum                           K26.9, Duodenal ulcer, unspecified as acute or                            chronic, without hemorrhage or perforation                           D62, Acute posthemorrhagic anemia                           K92.1, Melena (includes Hematochezia) CPT copyright 2019 American Medical Association. All rights reserved. The codes documented in this report are preliminary and upon coder review may  be revised to meet current compliance  requirements. Meryl Dare, MD 10/09/2020 3:06:57 PM This report has been signed electronically. Number of Addenda: 0

## 2020-10-09 NOTE — Progress Notes (Signed)
After receiving new orders for patients 6.3 hgb. Nurse talked with patient about needing occult blood card stool sample, patient reported to nurse that the last time he went to bathroom around 0030 he had 'black and runny stool. Nurse notified MD of patients statement and told patient to tell nurse next time needing to have BM to receive sample.

## 2020-10-09 NOTE — Transfer of Care (Addendum)
Immediate Anesthesia Transfer of Care Note  Patient: Dennis [name]  Procedure(s) Performed: ESOPHAGOGASTRODUODENOSCOPY (EGD) WITH PROPOFOL (N/A ) ESOPHAGEAL BANDING BIOPSY  Patient Location: PACU  Anesthesia Type:MAC and General  Level of Consciousness: awake, alert  and oriented  Airway & Oxygen Therapy: Patient Spontanous Breathing and Patient connected to face mask oxygen  Post-op Assessment: Report given to RN and Post -op Vital signs reviewed and stable  Post vital signs: Reviewed and stable  Last Vitals:  Vitals Value Taken Time  BP 147/76 10/09/20 1455  Temp    Pulse 115 10/09/20 1501  Resp 24 10/09/20 1501  SpO2 97 % 10/09/20 1501  Vitals shown include unvalidated device data.  Last Pain:  Vitals:   10/09/20 1321  TempSrc: Oral  PainSc: 5       Patients Stated Pain Goal: 2 (35/00/93 8182)  Complications: No complications documented.

## 2020-10-09 NOTE — Interval H&P Note (Signed)
History and Physical Interval Note:  10/09/2020 1:10 PM  Dennis [name]  has presented today for surgery, with the diagnosis of Anemia, melena in patient with acute pancreatitis.  The various methods of treatment have been discussed with the patient and family. After consideration of risks, benefits and other options for treatment, the patient has consented to  Procedure(s): ESOPHAGOGASTRODUODENOSCOPY (EGD) WITH PROPOFOL (N/A) as a surgical intervention.  The patient's history has been reviewed, patient examined, no change in status, stable for surgery.  I have reviewed the patient's chart and labs.  Questions were answered to the patient's satisfaction.     Venita Lick. Russella Dar

## 2020-10-09 NOTE — Anesthesia Preprocedure Evaluation (Addendum)
Anesthesia Evaluation  Patient identified by MRN, date of birth, ID band Patient awake    Reviewed: Allergy & Precautions, NPO status , Patient's Chart, lab work & pertinent test results, reviewed documented beta blocker date and time   Airway Mallampati: II  TM Distance: >3 FB Neck ROM: Full    Dental  (+) Poor Dentition, Missing, Chipped, Dental Advisory Given,    Pulmonary neg pulmonary ROS,    Pulmonary exam normal breath sounds clear to auscultation       Cardiovascular negative cardio ROS Normal cardiovascular exam Rhythm:Regular Rate:Normal     Neuro/Psych negative neurological ROS  negative psych ROS   GI/Hepatic Neg liver ROS, Acute pancreatitis Melena Abnormal CT scan abdomen   Endo/Other  negative endocrine ROS  Renal/GU Renal InsufficiencyRenal disease  negative genitourinary   Musculoskeletal negative musculoskeletal ROS (+)   Abdominal   Peds  Hematology  (+) anemia ,   Anesthesia Other Findings   Reproductive/Obstetrics                            Anesthesia Physical Anesthesia Plan  ASA: II  Anesthesia Plan: MAC   Post-op Pain Management:    Induction: Intravenous  PONV Risk Score and Plan: 2 and Propofol infusion and Treatment may vary due to age or medical condition  Airway Management Planned: Natural Airway and Nasal Cannula  Additional Equipment:   Intra-op Plan:   Post-operative Plan:   Informed Consent: I have reviewed the patients History and Physical, chart, labs and discussed the procedure including the risks, benefits and alternatives for the proposed anesthesia with the patient or authorized representative who has indicated his/her understanding and acceptance.     Dental advisory given  Plan Discussed with: Anesthesiologist and CRNA  Anesthesia Plan Comments:         Anesthesia Quick Evaluation

## 2020-10-09 NOTE — Anesthesia Postprocedure Evaluation (Signed)
Anesthesia Post Note  Patient: Dennis [name]  Procedure(s) Performed: ESOPHAGOGASTRODUODENOSCOPY (EGD) WITH PROPOFOL (N/A ) ESOPHAGEAL BANDING BIOPSY     Patient location during evaluation: PACU Anesthesia Type: General Level of consciousness: awake and alert and oriented Pain management: pain level controlled Vital Signs Assessment: post-procedure vital signs reviewed and stable Respiratory status: spontaneous breathing, nonlabored ventilation, respiratory function stable and patient connected to nasal cannula oxygen Cardiovascular status: blood pressure returned to baseline and stable Postop Assessment: no apparent nausea or vomiting Anesthetic complications: no   No complications documented.  Last Vitals:  Vitals:   10/09/20 1525 10/09/20 1553  BP: (!) 150/88 139/83  Pulse: (!) 117 (!) 110  Resp: (!) 23 (!) 23  Temp:  37.6 C  SpO2: 95% 95%    Last Pain:  Vitals:   10/09/20 1515  TempSrc:   PainSc: 1                  See Beharry A.

## 2020-10-09 NOTE — Progress Notes (Signed)
RN paged with hgb 6.3. Last hgb 8.4, and no mention of bleeding in recent notes. Stat HH ordered to confirm. If still low - will transfuse. Anemia panel and FOBT ordered. Continue to monitor.

## 2020-10-09 NOTE — H&P (View-Only) (Signed)
Daily Rounding Note  10/09/2020, 10:07 AM  LOS: 10 days   SUBJECTIVE:   Chief complaint: Melena, acute anemia.     Patient now passing black, tarry stools.  Pt noticed black stools starting 2 d ago.  No N/V.  Tolerating clears.   Feels better.  abd pain better but stil TTP on exam.  Hgb at admit 10 d ago:  15.7 >> has steadily drifted down.  10.3 on 5/10 >> 8.4 on 5/11 >> 6.2 today.  Normal MCV. BUN has gone from 18 yest >> 26 today.   Had not been on any gastric acid suppressing medications.  Protonix 40 IV bid initiated this morning.  SQ Heparin d/cd, last injection was 2144 on 5/11.  OBJECTIVE:         Vital signs in last 24 hours:    Temp:  [98.3 F (36.8 C)-103.1 F (39.5 C)] 100.2 F (37.9 C) (05/12 0950) Pulse Rate:  [108-127] 111 (05/12 0950) Resp:  [18-20] 18 (05/12 0950) BP: (127-177)/(75-90) 127/82 (05/12 0950) SpO2:  [93 %-97 %] 93 % (05/12 0950) Weight:  [102.4 kg] 102.4 kg (05/12 0500) Last BM Date: 10/07/20 Filed Weights   10/07/20 0407 10/08/20 0500 10/09/20 0500  Weight: 102.1 kg 102.8 kg 102.4 kg   General: Patient looks well.  Sitting up alert in bed, comfortable. Heart: RRR. Chest: Clear bilaterally without labored breathing. Abdomen: Tense, slightly distended.  LUQ tender w/o G/R.  Active BS Extremities: no CCE Neuro/Psych: Alert.  Oriented x3.  No tremors, no gross deficits.  Calm, cooperative.  Intake/Output from previous day: 05/11 0701 - 05/12 0700 In: 555 [P.O.:240; Blood:315] Out: 1100 [Urine:1100]  Intake/Output this shift: Total I/O In: 266 [Blood:266] Out: -   Lab Results: Recent Labs    10/07/20 0258 10/08/20 0441 10/09/20 0136 10/09/20 0257  WBC 20.8* 17.8* 15.5*  --   HGB 10.3* 8.4* 6.3* 6.2*  HCT 31.6* 25.3* 19.2* 18.9*  PLT 210 208 213  --    BMET Recent Labs    10/07/20 0258 10/08/20 0441 10/09/20 0136  NA 129* 131* 130*  K 4.2 3.8 3.8  CL 95* 95* 96*   CO2 22 25 25   GLUCOSE 105* 108* 138*  BUN 18 18 26*  CREATININE 0.96 0.94 0.97  CALCIUM 8.0* 8.0* 7.9*   LFT Recent Labs    10/09/20 0136  PROT 5.3*  ALBUMIN 1.9*  AST 36  ALT 35  ALKPHOS 112  BILITOT 0.4    Studies/Results: CT CHEST ABDOMEN PELVIS W CONTRAST  Result Date: 10/07/2020 CLINICAL DATA:  Pancreatitis with persistent left lower lobe pneumonia. EXAM: CT CHEST, ABDOMEN, AND PELVIS WITH CONTRAST TECHNIQUE: Multidetector CT imaging of the chest, abdomen and pelvis was performed following the standard protocol during bolus administration of intravenous contrast. CONTRAST:  45mL OMNIPAQUE IOHEXOL 300 MG/ML  SOLN COMPARISON:  Abdomen/pelvis CT 09/28/2020. FINDINGS: CT CHEST FINDINGS Cardiovascular: The heart size is normal. No substantial pericardial effusion. No thoracic aortic aneurysm. Mediastinum/Nodes: No mediastinal lymphadenopathy. There is no hilar lymphadenopathy. The esophagus has normal imaging features. There is no axillary lymphadenopathy. Lungs/Pleura: Minimal subsegmental atelectasis noted right lower lobe. Left lower lobe collapse/consolidation evident with small to moderate left pleural effusion. Musculoskeletal: No worrisome lytic or sclerotic osseous abnormality. CT ABDOMEN PELVIS FINDINGS Hepatobiliary: No suspicious focal abnormality within the liver parenchyma. Heterogeneous perfusion in the left liver likely accentuated by periportal edema. High attenuation material in the lumen of the gallbladder may be sludge  or vicarious excretion of contrast from prior imaging. Extrahepatic bile ducts not well demonstrated but no definite biliary dilatation. Pancreas: Pancreas is diffusely edematous through the body and tail with no definite non enhancement to suggest pancreatic necrosis 2.1 x 1.8 cm fluid collection in the body of pancreas is new in the interval and may reflect confluent edema or of all vein pseudocyst. There is extensive edema throughout the anterior pararenal  space with involvement of the transverse mesocolon, splenic flexure, and proximal descending colon. Spleen: No splenomegaly. No focal mass lesion. Adrenals/Urinary Tract: No adrenal nodule or mass. Kidneys unremarkable. No evidence for hydroureter. The urinary bladder appears normal for the degree of distention. Stomach/Bowel: Stomach is distorted secondary to a large perigastric fluid collection along the greater curvature measuring roughly 12 x 8 x 10 cm. A second perigastric fluid collection is seen along the cranial aspect of the antrum, between the left liver and the gastric antrum measuring approximately 5 x 2 x 8 cm. Duodenum is normally positioned as is the ligament of Treitz. No small bowel wall thickening. No small bowel dilatation. The terminal ileum is normal. The appendix is not well visualized, but there is no edema or inflammation in the region of the cecum. Circumferential wall thickening is noted in the distal transverse colon, splenic flexure, and proximal descending colon. These of the segments surrounded by presumed inflammatory fluid. Sigmoid colon and rectum are unremarkable. Vascular/Lymphatic: No abdominal aortic aneurysm. Celiac axis and SMA are surrounded by edema/inflammation without complicating feature evident. Portal vein is surrounded by edema/inflammation and appears markedly attenuated/narrowed proximally, but does remain patent. SMV appears widely patent. Splenic vein is not visualized consistent with thrombosis. New venous collateralization is noted in the anterior left abdomen. Small peripancreatic lymph nodes evident with small nodes seen in the hepatoduodenal ligament. No pelvic sidewall lymphadenopathy. Reproductive: No focal mass lesion. No dilatation of the main duct. No intraparenchymal cyst. No peripancreatic edema. Other: Small volume fluid seen adjacent to the inferior liver and inferior spleen. Small volume fluid noted in each paracolic gutter and in the central pelvis.  Musculoskeletal: Mild body wall edema evident. No worrisome lytic or sclerotic osseous abnormality. IMPRESSION: 1. Diffuse edema in the body and tail of pancreas with no definite areas of pancreatic necrosis. 2. 2.1 x 1.8 cm fluid collection in the body of pancreas appears to communicate with the peripancreatic fluid/edema. 3. Interval development of large perigastric fluid collection along the greater curvature of the stomach with a second smaller collection along the gastric antrum. While both collections have relatively well-defined margins, neither has a well organized or enhancing rim. Both collections likely reflect evolving pseudocyst. Although no gas is seen in either collection cannot be excluded by CT, superinfection imaging. 4. 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 5. Circumferential wall thickening in the distal transverse colon, splenic flexure, and proximal descending colon. These changes are presumably secondary as these segments of the colon are surrounded by ill-defined fluid attenuation, likely tracking through the transverse mesocolon from the pancreas. 6. Small volume ascites. 7. Left lower lobe collapse/consolidation with small to moderate left pleural effusion. 8. Heterogeneous perfusion in the left liver likely accentuated by periportal edema. 9. High attenuation material in the lumen of the gallbladder may be sludge or vicarious excretion of contrast from prior imaging. Electronically Signed   By: Kennith Center M.D.   On: 10/07/2020 15:18   Scheduled Meds: . sodium chloride   Intravenous Once  . sodium chloride  Intravenous Once  . amLODipine  10 mg Oral Daily  . Chlorhexidine Gluconate Cloth  6 each Topical Q0600  . fenofibrate  54 mg Oral Daily  . pantoprazole (PROTONIX) IV  40 mg Intravenous Q12H  . thiamine  100 mg Oral Daily   Continuous Infusions: . sodium chloride Stopped (10/01/20 1059)  . sodium chloride 50 mL/hr at 10/08/20  0945  . ondansetron (ZOFRAN) IV Stopped (09/30/20 2014)  . piperacillin-tazobactam (ZOSYN)  IV 3.375 g (10/09/20 0033)   PRN Meds:.sodium chloride, acetaminophen, docusate sodium, hydrALAZINE, HYDROmorphone (DILAUDID) injection, labetalol, LORazepam, ondansetron (ZOFRAN) IV, oxyCODONE   ASSESMENT:   *    Upper GI bleed with melena, acute blood loss anemia. PRBC x 1 ordered R/o ulcer disease.    *    Recurrent acute pancreatitis.  Initial episode attributed to alcohol but recently found to have markedly elevated triglycerides.  LFTs placed all normalized. 10/07/2020 CTAP with diffuse pancreatitis at body and tail but no evidence of necrosis.  Developing fluid collections in the pancreatic body, greater curvature and gastric antrum areas.  SVT w collaterals.  Colonic wall thickening.  Left lower lobe lung collapse/consolidation, pleural effusion.  Periportal edema.  Possible sludge or vicarious excretion of contrast in gallbladder.  *   Hypertriglyceridemia (HTG). Trigs 1014 >> 255.  Is not receiving any meds for HTG  *   Hyponatremia.  130.  *   LLL PNA.  Day 3 Zosyn.   PLAN   *   EGD planned for 3 PM today.  Discussed with patient and he is agreeable to proceed.   Jennye Moccasin  10/09/2020, 10:07 AM Phone [phone]    Attending Physician Note   I have taken an interval history, reviewed the chart and examined the patient. I agree with the Advanced Practitioner's note, impression and recommendations.   * UGI bleed with melena and ABL anemia. R/O ulcer, etc. IV PPI, transfuse to maintain Hgb > 7 and schedule EGD today.  * Acute pancreatitis felt secondary to hypertriglyceridemia or alcohol, improving. No necrosis on CT. Peripancreatic fluid collections could evolve into pseudocysts.   Claudette Head, MD FACG [phone]

## 2020-10-09 NOTE — Anesthesia Procedure Notes (Signed)
Procedure Name: MAC Date/Time: 10/09/2020 1:59 PM Performed by: Terrence Dupont, CRNA Pre-anesthesia Checklist: Emergency Drugs available, Patient identified, Suction available and Patient being monitored Patient Re-evaluated:Patient Re-evaluated prior to induction Oxygen Delivery Method: Nasal cannula Placement Confirmation: positive ETCO2 Dental Injury: Teeth and Oropharynx as per pre-operative assessment

## 2020-10-09 NOTE — Progress Notes (Addendum)
Daily Rounding Note  10/09/2020, 10:07 AM  LOS: 10 days   SUBJECTIVE:   Chief complaint: Melena, acute anemia.     Patient now passing black, tarry stools.  Pt noticed black stools starting 2 d ago.  No N/V.  Tolerating clears.   Feels better.  abd pain better but stil TTP on exam.  Hgb at admit 10 d ago:  15.7 >> has steadily drifted down.  10.3 on 5/10 >> 8.4 on 5/11 >> 6.2 today.  Normal MCV. BUN has gone from 18 yest >> 26 today.   Had not been on any gastric acid suppressing medications.  Protonix 40 IV bid initiated this morning.  SQ Heparin d/cd, last injection was 2144 on 5/11.  OBJECTIVE:         Vital signs in last 24 hours:    Temp:  [98.3 F (36.8 C)-103.1 F (39.5 C)] 100.2 F (37.9 C) (05/12 0950) Pulse Rate:  [108-127] 111 (05/12 0950) Resp:  [18-20] 18 (05/12 0950) BP: (127-177)/(75-90) 127/82 (05/12 0950) SpO2:  [93 %-97 %] 93 % (05/12 0950) Weight:  [102.4 kg] 102.4 kg (05/12 0500) Last BM Date: 10/07/20 Filed Weights   10/07/20 0407 10/08/20 0500 10/09/20 0500  Weight: 102.1 kg 102.8 kg 102.4 kg   General: Patient looks well.  Sitting up alert in bed, comfortable. Heart: RRR. Chest: Clear bilaterally without labored breathing. Abdomen: Tense, slightly distended.  LUQ tender w/o G/R.  Active BS Extremities: no CCE Neuro/Psych: Alert.  Oriented x3.  No tremors, no gross deficits.  Calm, cooperative.  Intake/Output from previous day: 05/11 0701 - 05/12 0700 In: 555 [P.O.:240; Blood:315] Out: 1100 [Urine:1100]  Intake/Output this shift: Total I/O In: 266 [Blood:266] Out: -   Lab Results: Recent Labs    10/07/20 0258 10/08/20 0441 10/09/20 0136 10/09/20 0257  WBC 20.8* 17.8* 15.5*  --   HGB 10.3* 8.4* 6.3* 6.2*  HCT 31.6* 25.3* 19.2* 18.9*  PLT 210 208 213  --    BMET Recent Labs    10/07/20 0258 10/08/20 0441 10/09/20 0136  NA 129* 131* 130*  K 4.2 3.8 3.8  CL 95* 95* 96*   CO2 22 25 25   GLUCOSE 105* 108* 138*  BUN 18 18 26*  CREATININE 0.96 0.94 0.97  CALCIUM 8.0* 8.0* 7.9*   LFT Recent Labs    10/09/20 0136  PROT 5.3*  ALBUMIN 1.9*  AST 36  ALT 35  ALKPHOS 112  BILITOT 0.4    Studies/Results: CT CHEST ABDOMEN PELVIS W CONTRAST  Result Date: 10/07/2020 CLINICAL DATA:  Pancreatitis with persistent left lower lobe pneumonia. EXAM: CT CHEST, ABDOMEN, AND PELVIS WITH CONTRAST TECHNIQUE: Multidetector CT imaging of the chest, abdomen and pelvis was performed following the standard protocol during bolus administration of intravenous contrast. CONTRAST:  45mL OMNIPAQUE IOHEXOL 300 MG/ML  SOLN COMPARISON:  Abdomen/pelvis CT 09/28/2020. FINDINGS: CT CHEST FINDINGS Cardiovascular: The heart size is normal. No substantial pericardial effusion. No thoracic aortic aneurysm. Mediastinum/Nodes: No mediastinal lymphadenopathy. There is no hilar lymphadenopathy. The esophagus has normal imaging features. There is no axillary lymphadenopathy. Lungs/Pleura: Minimal subsegmental atelectasis noted right lower lobe. Left lower lobe collapse/consolidation evident with small to moderate left pleural effusion. Musculoskeletal: No worrisome lytic or sclerotic osseous abnormality. CT ABDOMEN PELVIS FINDINGS Hepatobiliary: No suspicious focal abnormality within the liver parenchyma. Heterogeneous perfusion in the left liver likely accentuated by periportal edema. High attenuation material in the lumen of the gallbladder may be sludge  or vicarious excretion of contrast from prior imaging. Extrahepatic bile ducts not well demonstrated but no definite biliary dilatation. Pancreas: Pancreas is diffusely edematous through the body and tail with no definite non enhancement to suggest pancreatic necrosis 2.1 x 1.8 cm fluid collection in the body of pancreas is new in the interval and may reflect confluent edema or of all vein pseudocyst. There is extensive edema throughout the anterior pararenal  space with involvement of the transverse mesocolon, splenic flexure, and proximal descending colon. Spleen: No splenomegaly. No focal mass lesion. Adrenals/Urinary Tract: No adrenal nodule or mass. Kidneys unremarkable. No evidence for hydroureter. The urinary bladder appears normal for the degree of distention. Stomach/Bowel: Stomach is distorted secondary to a large perigastric fluid collection along the greater curvature measuring roughly 12 x 8 x 10 cm. A second perigastric fluid collection is seen along the cranial aspect of the antrum, between the left liver and the gastric antrum measuring approximately 5 x 2 x 8 cm. Duodenum is normally positioned as is the ligament of Treitz. No small bowel wall thickening. No small bowel dilatation. The terminal ileum is normal. The appendix is not well visualized, but there is no edema or inflammation in the region of the cecum. Circumferential wall thickening is noted in the distal transverse colon, splenic flexure, and proximal descending colon. These of the segments surrounded by presumed inflammatory fluid. Sigmoid colon and rectum are unremarkable. Vascular/Lymphatic: No abdominal aortic aneurysm. Celiac axis and SMA are surrounded by edema/inflammation without complicating feature evident. Portal vein is surrounded by edema/inflammation and appears markedly attenuated/narrowed proximally, but does remain patent. SMV appears widely patent. Splenic vein is not visualized consistent with thrombosis. New venous collateralization is noted in the anterior left abdomen. Small peripancreatic lymph nodes evident with small nodes seen in the hepatoduodenal ligament. No pelvic sidewall lymphadenopathy. Reproductive: No focal mass lesion. No dilatation of the main duct. No intraparenchymal cyst. No peripancreatic edema. Other: Small volume fluid seen adjacent to the inferior liver and inferior spleen. Small volume fluid noted in each paracolic gutter and in the central pelvis.  Musculoskeletal: Mild body wall edema evident. No worrisome lytic or sclerotic osseous abnormality. IMPRESSION: 1. Diffuse edema in the body and tail of pancreas with no definite areas of pancreatic necrosis. 2. 2.1 x 1.8 cm fluid collection in the body of pancreas appears to communicate with the peripancreatic fluid/edema. 3. Interval development of large perigastric fluid collection along the greater curvature of the stomach with a second smaller collection along the gastric antrum. While both collections have relatively well-defined margins, neither has a well organized or enhancing rim. Both collections likely reflect evolving pseudocyst. Although no gas is seen in either collection cannot be excluded by CT, superinfection imaging. 4. 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 5. Circumferential wall thickening in the distal transverse colon, splenic flexure, and proximal descending colon. These changes are presumably secondary as these segments of the colon are surrounded by ill-defined fluid attenuation, likely tracking through the transverse mesocolon from the pancreas. 6. Small volume ascites. 7. Left lower lobe collapse/consolidation with small to moderate left pleural effusion. 8. Heterogeneous perfusion in the left liver likely accentuated by periportal edema. 9. High attenuation material in the lumen of the gallbladder may be sludge or vicarious excretion of contrast from prior imaging. Electronically Signed   By: Kennith Center M.D.   On: 10/07/2020 15:18   Scheduled Meds: . sodium chloride   Intravenous Once  . sodium chloride  Intravenous Once  . amLODipine  10 mg Oral Daily  . Chlorhexidine Gluconate Cloth  6 each Topical Q0600  . fenofibrate  54 mg Oral Daily  . pantoprazole (PROTONIX) IV  40 mg Intravenous Q12H  . thiamine  100 mg Oral Daily   Continuous Infusions: . sodium chloride Stopped (10/01/20 1059)  . sodium chloride 50 mL/hr at 10/08/20  0945  . ondansetron (ZOFRAN) IV Stopped (09/30/20 2014)  . piperacillin-tazobactam (ZOSYN)  IV 3.375 g (10/09/20 0033)   PRN Meds:.sodium chloride, acetaminophen, docusate sodium, hydrALAZINE, HYDROmorphone (DILAUDID) injection, labetalol, LORazepam, ondansetron (ZOFRAN) IV, oxyCODONE   ASSESMENT:   *    Upper GI bleed with melena, acute blood loss anemia. PRBC x 1 ordered R/o ulcer disease.    *    Recurrent acute pancreatitis.  Initial episode attributed to alcohol but recently found to have markedly elevated triglycerides.  LFTs placed all normalized. 10/07/2020 CTAP with diffuse pancreatitis at body and tail but no evidence of necrosis.  Developing fluid collections in the pancreatic body, greater curvature and gastric antrum areas.  SVT w collaterals.  Colonic wall thickening.  Left lower lobe lung collapse/consolidation, pleural effusion.  Periportal edema.  Possible sludge or vicarious excretion of contrast in gallbladder.  *   Hypertriglyceridemia (HTG). Trigs 1014 >> 255.  Is not receiving any meds for HTG  *   Hyponatremia.  130.  *   LLL PNA.  Day 3 Zosyn.   PLAN   *   EGD planned for 3 PM today.  Discussed with patient and he is agreeable to proceed.   Jennye Moccasin  10/09/2020, 10:07 AM Phone [phone]    Attending Physician Note   I have taken an interval history, reviewed the chart and examined the patient. I agree with the Advanced Practitioner's note, impression and recommendations.   * UGI bleed with melena and ABL anemia. R/O ulcer, etc. IV PPI, transfuse to maintain Hgb > 7 and schedule EGD today.  * Acute pancreatitis felt secondary to hypertriglyceridemia or alcohol, improving. No necrosis on CT. Peripancreatic fluid collections could evolve into pseudocysts.   Claudette Head, MD FACG [phone]

## 2020-10-09 NOTE — Progress Notes (Signed)
PROGRESS NOTE    Dennis [name]   NAT:[id number]  DOB: [birth year]  PCP: No primary care provider on file.    DOA: 09/29/2020 LOS: 10   Brief Narrative   30 year old male with history of EtOH, tobacco and marijuana use, remote seizures came into the hospital with abdominal pain and diagnosed with acute pancreatitis.  2 weeks ago he was admitted to [facility] ED, diagnosed with alcoholic pancreatitis and discharged home.  He has not had any alcohol since.  His abdominal pain recurred and came back to the hospital, he was found to have triglycerides of 2400 and was sent to [facility], ICU to be evaluated for possible Plex for hypertriglyceridemia.  Symptomatically improved with insulin and dextrose infusion.  Remains in the hospital with persistent pain, development of pseudocyst. Patient also developed left lower lobe pneumonia. 5/11, continues to drop hemoglobin.  Black tarry stool.    Assessment & Plan   Principal Problem:   Acute pancreatitis Active Problems:   Hypertriglyceridemia   AKI (acute kidney injury) (HCC)   Leukocytosis   Abnormal CT of the abdomen  Acute blood loss anemia: Suspect stress related gastric ulcer.  Started on PPI. Hemoglobin 11-6.2 in 1 week.  FOBT positive.  Patient was not on any PPI protection. He will benefit with upper GI endoscopy, keep n.p.o.  Discussed with gastroenterology. 1 unit PRBC ordered overnight, will order second units today.  Acute pancreatitis due to EtOH and hypertriglyceridemia - initially admitted to ICU, treated with insulin and dextrose infusion, now off.  TG's now 295 (less than 500 since 5/4). --Supportive care: IVF's (reduced to 2ml/hr), pain control --Soft diet.  Patient tolerating more than 50% of the meal. --CT chest/abdomen/pelvis with multiple pancreatic cyst, 10 cm pseudocyst with some compression on the stomach.  Seen by gastroenterology, suggested symptomatic treatment and 6 weeks follow-up. -- Start on low-dose  fenofibrate, earlier stopped due to abdominal pain which could be related to primary symptom.  Left lower lobe pneumonia - CXR obtained 5/9 due to worsened leukocytosis and diminished lung bases on exam, showed LLL infiltrate and small pleural effusion.  CT scan with left lower lobe collapse/consolidation with some pleural effusion. --On different antibiotics.  Currently on Zosyn that we will continue. --Mobility and aggressive chest physiotherapy. -- Persistent fever, will send to IR for diagnostic thoracentesis.  Sepsis due to Pneumonia vs SIRS from pancreatitis. With leukocytosis, fever, tachycardia PNA identified on CXR 5/9 but was not excluded on xray of 5/3 with bibasilar atelectasis vs infiltrate. --Sepsis physiology improving, continues to have fever which is possibly due to metabolic fever from pancreatitis. --Abx as above  Hyponatremia -fairly stable.  AKI Hypomagnesemia Hypokalema Hypocalcemia Non-anion gap metabolic acidosis Aggressively replaced and normalized.  Hypertension - continue amlodipine.  Stable.  Polysubstance abuse - motivated and determined to quit.  Last drink of EtOH now 4 weeks ago.   Patient BMI: Body mass index is 29.78 kg/m.   DVT prophylaxis: SCDs Start: 09/30/20 0121   Diet:  Diet Orders (From admission, onward)    Start     Ordered   10/09/20 0748  Diet NPO time specified  Diet effective now        10/09/20 0747            Code Status: Full Code    Subjective 10/09/20   Patient seen and examined.  His abdomen pain is overall better.  He still has postprandial pain.  More than 3 bowel movement overnight, loose and dark tarry.  Overnight events noted.  Hemoglobin 6.2, 1 unit PRBC started. Continues to have low-grade fever, denies any shortness of breath.  He does have pleuritic pain on the left side.   Disposition Plan & Communication   Status is: Inpatient  Remains inpatient appropriate because:IV treatments appropriate due to  intensity of illness or inability to take PO   Dispo: The patient is from: Home              Anticipated d/c is to: Home              Patient currently is not medically stable to d/c.   Difficult to place patient No    Consults, Procedures, Significant Events   Consultants:   Gastroenterology  Procedures:   None  Antimicrobials:  Anti-infectives (From admission, onward)   Start     Dose/Rate Route Frequency Ordered Stop   10/07/20 1645  piperacillin-tazobactam (ZOSYN) IVPB 3.375 g        3.375 g 12.5 mL/hr over 240 Minutes Intravenous Every 8 hours 10/07/20 1557     10/07/20 1000  azithromycin (ZITHROMAX) tablet 500 mg  Status:  Discontinued        500 mg Oral Daily 10/07/20 0749 10/07/20 1542   10/07/20 0845  cefTRIAXone (ROCEPHIN) 2 g in sodium chloride 0.9 % 100 mL IVPB  Status:  Discontinued        2 g 200 mL/hr over 30 Minutes Intravenous Every 24 hours 10/07/20 0749 10/07/20 1542   10/06/20 1315  amoxicillin-clavulanate (AUGMENTIN) 875-125 MG per tablet 1 tablet  Status:  Discontinued        1 tablet Oral Every 12 hours 10/06/20 1227 10/07/20 0749        Micro  No positive data yet.  Objective   Vitals:   10/09/20 0615 10/09/20 0638 10/09/20 0811 10/09/20 0834  BP: (!) 162/87 (!) 163/81 139/82 138/78  Pulse: (!) 110 (!) 113 (!) 114 (!) 111  Resp: 20 20 20 18   Temp: 99.2 F (37.3 C) 99.5 F (37.5 C) (!) 101.1 F (38.4 C) (!) 101.2 F (38.4 C)  TempSrc: Oral Oral Oral Oral  SpO2: 96% 94% 94%   Weight:      Height:        Intake/Output Summary (Last 24 hours) at 10/09/2020 0942 Last data filed at 10/09/2020 0834 Gross per 24 hour  Intake 821 ml  Output 1100 ml  Net -279 ml   Filed Weights   10/07/20 0407 10/08/20 0500 10/09/20 0500  Weight: 102.1 kg 102.8 kg 102.4 kg    General: Looks comfortable on room air. Slightly anxious. Cardiovascular: S1-S2 normal.  No murmurs.  Tachycardic. Respiratory: Mostly bilateral clear.  Decreased air entry on  the left side. Gastrointestinal: Soft.  Distended and mildly tender all over.  Bowel sounds present.  No rigidity or guarding. Ext: No edema.  No cyanosis. Neuro: Alert oriented x4.  Equal strength in all extremities.  Labs   Data Reviewed: I have personally reviewed following labs and imaging studies  CBC: Recent Labs  Lab 10/05/20 0205 10/06/20 0440 10/07/20 0258 10/08/20 0441 10/09/20 0136 10/09/20 0257  WBC 13.8* 18.0* 20.8* 17.8* 15.5*  --   NEUTROABS  --   --   --  15.5* 13.0*  --   HGB 11.1* 11.0* 10.3* 8.4* 6.3* 6.2*  HCT 32.6* 33.1* 31.6* 25.3* 19.2* 18.9*  MCV 96.2 96.2 97.5 96.6 97.5  --   PLT 162 198 210 208 213  --  Basic Metabolic Panel: Recent Labs  Lab 10/03/20 0935 10/05/20 0205 10/06/20 0440 10/07/20 0258 10/08/20 0441 10/09/20 0136  NA 131* 131* 131* 129* 131* 130*  K 4.3 3.9 4.1 4.2 3.8 3.8  CL 96* 100 98 95* 95* 96*  CO2 27 23 22 22 25 25   GLUCOSE 125* 95 112* 105* 108* 138*  BUN 12 14 15 18 18  26*  CREATININE 0.91 0.93 0.90 0.96 0.94 0.97  CALCIUM 8.4* 8.2* 8.1* 8.0* 8.0* 7.9*  MG 2.0 1.9  --  1.9  --  2.0  PHOS  --  3.9  --   --   --  3.8   GFR: Estimated Creatinine Clearance: 141.3 mL/min (by C-G formula based on SCr of 0.97 mg/dL). Liver Function Tests: Recent Labs  Lab 10/03/20 0935 10/05/20 0205 10/06/20 0440 10/09/20 0136  AST 28 19 27  36  ALT 41 30 30 35  ALKPHOS 106 102 119 112  BILITOT 2.1* 1.3* 1.2 0.4  PROT 6.0* 5.6* 5.6* 5.3*  ALBUMIN 2.4* 2.2* 2.2* 1.9*   Recent Labs  Lab 10/05/20 0205 10/08/20 0441  LIPASE 43 69*   No results for input(s): AMMONIA in the last 168 hours. Coagulation Profile: No results for input(s): INR, PROTIME in the last 168 hours. Cardiac Enzymes: No results for input(s): CKTOTAL, CKMB, CKMBINDEX, TROPONINI in the last 168 hours. BNP (last 3 results) No results for input(s): PROBNP in the last 8760 hours. HbA1C: No results for input(s): HGBA1C in the last 72 hours. CBG: Recent Labs   Lab 10/05/20 0523 10/05/20 0947 10/05/20 1244 10/05/20 1959 10/06/20 0331  GLUCAP 99 115* 124* 107* 114*   Lipid Profile: Recent Labs    10/07/20 0258  TRIG 255*   Thyroid Function Tests: No results for input(s): TSH, T4TOTAL, FREET4, T3FREE, THYROIDAB in the last 72 hours. Anemia Panel: Recent Labs    10/09/20 0257  VITAMINB12 822  FOLATE 6.7  FERRITIN 1,806*  TIBC 209*  IRON 38*  RETICCTPCT 2.1   Sepsis Labs: Recent Labs  Lab 10/06/20 1448 10/07/20 0924 10/08/20 0441 10/09/20 0136  PROCALCITON 1.22 1.31 1.25 1.29  LATICACIDVEN 0.8  --   --   --     Recent Results (from the past 240 hour(s))  MRSA PCR Screening     Status: None   Collection Time: 09/29/20 11:07 PM   Specimen: Nasal Mucosa; Nasopharyngeal  Result Value Ref Range Status   MRSA by PCR NEGATIVE NEGATIVE Final    Comment:        The GeneXpert MRSA Assay (FDA approved for NASAL specimens only), is one component of a comprehensive MRSA colonization surveillance program. It is not intended to diagnose MRSA infection nor to guide or monitor treatment for MRSA infections. Performed at [facility], [address], [place], [facility] [address]   Culture, blood (routine x 2) Call MD if unable to obtain prior to antibiotics being given     Status: None (Preliminary result)   Collection Time: 10/07/20  9:24 AM   Specimen: BLOOD  Result Value Ref Range Status   Specimen Description BLOOD RIGHT ANTECUBITAL  Final   Special Requests   Final    BOTTLES DRAWN AEROBIC AND ANAEROBIC Blood Culture results may not be optimal due to an inadequate volume of blood received in culture bottles   Culture   Final    NO GROWTH 2 DAYS Performed at [facility], [address], [place], [facility] [address]    Report Status  PENDING  Incomplete  Culture, blood (routine x 2) Call MD if unable to obtain prior to antibiotics being given     Status: None (Preliminary result)   Collection Time: 10/07/20  9:24  AM   Specimen: BLOOD RIGHT FOREARM  Result Value Ref Range Status   Specimen Description BLOOD RIGHT FOREARM  Final   Special Requests   Final    BOTTLES DRAWN AEROBIC AND ANAEROBIC Blood Culture results may not be optimal due to an inadequate volume of blood received in culture bottles   Culture   Final    NO GROWTH 2 DAYS Performed at [facility], [address], [place], [place] [address]    Report Status PENDING  Incomplete      Imaging Studies   CT CHEST ABDOMEN PELVIS W CONTRAST  Result Date: 10/07/2020 CLINICAL DATA:  Pancreatitis with persistent left lower lobe pneumonia. EXAM: CT CHEST, ABDOMEN, AND PELVIS WITH CONTRAST TECHNIQUE: Multidetector CT imaging of the chest, abdomen and pelvis was performed following the standard protocol during bolus administration of intravenous contrast. CONTRAST:  64mL OMNIPAQUE IOHEXOL 300 MG/ML  SOLN COMPARISON:  Abdomen/pelvis CT 09/28/2020. FINDINGS: CT CHEST FINDINGS Cardiovascular: The heart size is normal. No substantial pericardial effusion. No thoracic aortic aneurysm. Mediastinum/Nodes: No mediastinal lymphadenopathy. There is no hilar lymphadenopathy. The esophagus has normal imaging features. There is no axillary lymphadenopathy. Lungs/Pleura: Minimal subsegmental atelectasis noted right lower lobe. Left lower lobe collapse/consolidation evident with small to moderate left pleural effusion. Musculoskeletal: No worrisome lytic or sclerotic osseous abnormality. CT ABDOMEN PELVIS FINDINGS Hepatobiliary: No suspicious focal abnormality within the liver parenchyma. Heterogeneous perfusion in the left liver likely accentuated by periportal edema. High attenuation material in the lumen of the gallbladder may be sludge or vicarious excretion of contrast from prior imaging. Extrahepatic bile ducts not well demonstrated but no definite biliary dilatation. Pancreas: Pancreas is diffusely edematous through the body and tail with no definite non  enhancement to suggest pancreatic necrosis 2.1 x 1.8 cm fluid collection in the body of pancreas is new in the interval and may reflect confluent edema or of all vein pseudocyst. There is extensive edema throughout the anterior pararenal space with involvement of the transverse mesocolon, splenic flexure, and proximal descending colon. Spleen: No splenomegaly. No focal mass lesion. Adrenals/Urinary Tract: No adrenal nodule or mass. Kidneys unremarkable. No evidence for hydroureter. The urinary bladder appears normal for the degree of distention. Stomach/Bowel: Stomach is distorted secondary to a large perigastric fluid collection along the greater curvature measuring roughly 12 x 8 x 10 cm. A second perigastric fluid collection is seen along the cranial aspect of the antrum, between the left liver and the gastric antrum measuring approximately 5 x 2 x 8 cm. Duodenum is normally positioned as is the ligament of Treitz. No small bowel wall thickening. No small bowel dilatation. The terminal ileum is normal. The appendix is not well visualized, but there is no edema or inflammation in the region of the cecum. Circumferential wall thickening is noted in the distal transverse colon, splenic flexure, and proximal descending colon. These of the segments surrounded by presumed inflammatory fluid. Sigmoid colon and rectum are unremarkable. Vascular/Lymphatic: No abdominal aortic aneurysm. Celiac axis and SMA are surrounded by edema/inflammation without complicating feature evident. Portal vein is surrounded by edema/inflammation and appears markedly attenuated/narrowed proximally, but does remain patent. SMV appears widely patent. Splenic vein is not visualized consistent with thrombosis. New venous collateralization is noted in the anterior left abdomen. Small peripancreatic lymph  nodes evident with small nodes seen in the hepatoduodenal ligament. No pelvic sidewall lymphadenopathy. Reproductive: No focal mass lesion. No  dilatation of the main duct. No intraparenchymal cyst. No peripancreatic edema. Other: Small volume fluid seen adjacent to the inferior liver and inferior spleen. Small volume fluid noted in each paracolic gutter and in the central pelvis. Musculoskeletal: Mild body wall edema evident. No worrisome lytic or sclerotic osseous abnormality. IMPRESSION: 1. Diffuse edema in the body and tail of pancreas with no definite areas of pancreatic necrosis. 2. 2.1 x 1.8 cm fluid collection in the body of pancreas appears to communicate with the peripancreatic fluid/edema. 3. Interval development of large perigastric fluid collection along the greater curvature of the stomach with a second smaller collection along the gastric antrum. While both collections have relatively well-defined margins, neither has a well organized or enhancing rim. Both collections likely reflect evolving pseudocyst. Although no gas is seen in either collection cannot be excluded by CT, superinfection imaging. 4. 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 5. Circumferential wall thickening in the distal transverse colon, splenic flexure, and proximal descending colon. These changes are presumably secondary as these segments of the colon are surrounded by ill-defined fluid attenuation, likely tracking through the transverse mesocolon from the pancreas. 6. Small volume ascites. 7. Left lower lobe collapse/consolidation with small to moderate left pleural effusion. 8. Heterogeneous perfusion in the left liver likely accentuated by periportal edema. 9. High attenuation material in the lumen of the gallbladder may be sludge or vicarious excretion of contrast from prior imaging. Electronically Signed   By: Kennith Center M.D.   On: 10/07/2020 15:18     Medications   Scheduled Meds: . sodium chloride   Intravenous Once  . amLODipine  10 mg Oral Daily  . Chlorhexidine Gluconate Cloth  6 each Topical Q0600  .  fenofibrate  54 mg Oral Daily  . pantoprazole (PROTONIX) IV  40 mg Intravenous Q12H  . thiamine  100 mg Oral Daily   Continuous Infusions: . sodium chloride Stopped (10/01/20 1059)  . sodium chloride 50 mL/hr at 10/08/20 0945  . ondansetron (ZOFRAN) IV Stopped (09/30/20 2014)  . piperacillin-tazobactam (ZOSYN)  IV 3.375 g (10/09/20 0033)       LOS: 10 days    Time spent: 30 minutes    Dorcas Carrow, MD [facility]  10/09/2020, 9:42 AM

## 2020-10-09 NOTE — Progress Notes (Signed)
10/09/20 at 0435  Rechecked Hgb to confirm   Critical Value: 6.2 Hgb   Name of Provider Notified: Zierle-Ghosh MD [facility]  Orders Received: Transfuse 1 unit of blood

## 2020-10-09 NOTE — Progress Notes (Addendum)
Date and time results received: 10/09/20 at 0216  Critical Value: 6.3 Hgb   Name of Provider Notified: Zierle-Ghosh MD [facility]   Orders Received: New orders received (see orders) at [id number]

## 2020-10-10 ENCOUNTER — Other Ambulatory Visit: Payer: Self-pay

## 2020-10-10 ENCOUNTER — Inpatient Hospital Stay (HOSPITAL_COMMUNITY): Payer: Medicaid Other

## 2020-10-10 ENCOUNTER — Telehealth: Payer: Self-pay

## 2020-10-10 DIAGNOSIS — K921 Melena: Secondary | ICD-10-CM

## 2020-10-10 DIAGNOSIS — R935 Abnormal findings on diagnostic imaging of other abdominal regions, including retroperitoneum: Secondary | ICD-10-CM

## 2020-10-10 DIAGNOSIS — I8500 Esophageal varices without bleeding: Secondary | ICD-10-CM

## 2020-10-10 DIAGNOSIS — K852 Alcohol induced acute pancreatitis without necrosis or infection: Secondary | ICD-10-CM

## 2020-10-10 HISTORY — PX: IR THORACENTESIS ASP PLEURAL SPACE W/IMG GUIDE: IMG5380

## 2020-10-10 LAB — COMPREHENSIVE METABOLIC PANEL
ALT: 34 U/L (ref 0–44)
AST: 37 U/L (ref 15–41)
Albumin: 2 g/dL — ABNORMAL LOW (ref 3.5–5.0)
Alkaline Phosphatase: 102 U/L (ref 38–126)
Anion gap: 9 (ref 5–15)
BUN: 14 mg/dL (ref 6–20)
CO2: 25 mmol/L (ref 22–32)
Calcium: 7.9 mg/dL — ABNORMAL LOW (ref 8.9–10.3)
Chloride: 96 mmol/L — ABNORMAL LOW (ref 98–111)
Creatinine, Ser: 1.02 mg/dL (ref 0.61–1.24)
GFR, Estimated: >60 mL/min (ref >60)
Glucose, Bld: 154 mg/dL — ABNORMAL HIGH (ref 70–99)
Potassium: 3.9 mmol/L (ref 3.5–5.1)
Sodium: 130 mmol/L — ABNORMAL LOW (ref 135–145)
Total Bilirubin: 0.7 mg/dL (ref 0.3–1.2)
Total Protein: 5.7 g/dL — ABNORMAL LOW (ref 6.5–8.1)

## 2020-10-10 LAB — GLUCOSE, PLEURAL OR PERITONEAL FLUID: Glucose, Fluid: 113 mg/dL

## 2020-10-10 LAB — BODY FLUID CELL COUNT WITH DIFFERENTIAL
Eos, Fluid: 0 %
Lymphs, Fluid: 18 %
Monocyte-Macrophage-Serous Fluid: 33 % — ABNORMAL LOW (ref 50–90)
Neutrophil Count, Fluid: 49 % — ABNORMAL HIGH (ref 0–25)
Total Nucleated Cell Count, Fluid: 1863 cu mm — ABNORMAL HIGH (ref 0–1000)

## 2020-10-10 LAB — ALBUMIN, PLEURAL OR PERITONEAL FLUID: Albumin, Fluid: 1.5 g/dL

## 2020-10-10 LAB — AMYLASE, PLEURAL OR PERITONEAL FLUID: Amylase, Fluid: 276 U/L

## 2020-10-10 MED ORDER — LIDOCAINE HCL 1 % IJ SOLN
INTRAMUSCULAR | Status: AC
  Filled 2020-10-10: qty 20

## 2020-10-10 NOTE — Telephone Encounter (Signed)
Patient scheduled for CT abdomen and pelvis with IV contrast, no oral contrast at [facility] CT on 11/28/20 at 8am. Patient has to arrive at 7:15am to drink water. Patient also scheduled for f/u visit with Dr. Russella Dar on 12/02/20 at 8:50am. Patient is still in patient at [facility]. Will contact patient once he is discharged.

## 2020-10-10 NOTE — Telephone Encounter (Signed)
Per Dr. 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 EGD scheduled at [facility] on 11/17/20 at 10:15am and covid test scheduled for 11/13/20 at 9:40am. Patient will be contacted after discharge to go over instructions.

## 2020-10-10 NOTE — Telephone Encounter (Signed)
-----   Message from Meryl Dare, MD sent at 10/07/2020  5:35 PM EDT ----- Hi, This is a new consult at [facility] who will be discharged soon with pancreatitis and evolving pseudocysts. Please arrange an outpatient CT AP in about 6 weeks and office follow up with me or PG a few days after the CT AP. Thx. MS

## 2020-10-10 NOTE — Progress Notes (Addendum)
PROGRESS NOTE    Dennis [name]   ZOX:[id number]  DOB: 09/01/1990  PCP: No primary care provider on file.    DOA: 09/29/2020 LOS: 3511   Brief Narrative   30 year old male with history of EtOH, tobacco and marijuana use, remote seizures came into the hospital with abdominal pain and diagnosed with acute pancreatitis.  2 weeks ago he was admitted to [facility], diagnosed with alcoholic pancreatitis and discharged home.  He has not had any alcohol since.  His abdominal pain recurred and came back to the hospital, he was found to have triglycerides of 2400 and was sent to [facility] ICU to be evaluated for possible Plex for hypertriglyceridemia.  Symptomatically improved with insulin and dextrose infusion.  Remains in the hospital with persistent pain, development of pseudocyst. Patient also developed left lower lobe pneumonia. 5/11, continues to drop hemoglobin.  Black tarry stool. 5/12, underwent upper GI endoscopy with no active bleeding esophageal varices, large pseudocyst compressing on gastric fundus. Continues to run temperature.    Assessment & Plan   Principal Problem:   Acute pancreatitis Active Problems:   Hypertriglyceridemia   AKI (acute kidney injury) (HCC)   Leukocytosis   Abnormal CT of the abdomen   Melena   Acute blood loss anemia   Esophageal varices without bleeding (HCC)  Acute blood loss anemia:  Hemoglobin 11-6.2 in 1 week.  FOBT positive.   PRBC 2 units 5/12 with appropriate response to hemoglobin.  Hemoglobin pending today. Upper GI endoscopy 5/12 with esophageal varices banded with good results, no other active bleeding ulcers.  Symptomatic treatment. We will continue IV Protonix. Started on octreotide bolus and infusion, continue for 72 hours.  Acute pancreatitis due to EtOH and hypertriglyceridemia - initially admitted to ICU, treated with insulin and dextrose infusion, now off.  TG's now 295 (less than 500 since 5/4). --Supportive care:  IVF's (reduced to 3050ml/hr), pain control --Soft diet.  Tolerating meal. --CT chest/abdomen/pelvis with multiple pancreatic cyst, 10 cm pseudocyst with some compression on the stomach.  Seen by gastroenterology, suggested symptomatic treatment and 6 weeks follow-up. -- Start on low-dose fenofibrate, earlier stopped due to abdominal pain which could be related to primary symptom.  Left lower lobe pneumonia - CXR obtained 5/9 due to worsened leukocytosis and diminished lung bases on exam, showed LLL infiltrate and small pleural effusion.  CT scan with left lower lobe collapse/consolidation with some pleural effusion. --On different antibiotics.  Currently on Zosyn that we will continue. --Mobility and aggressive chest physiotherapy. -- Persistent fever, will send to IR for diagnostic thoracentesis today.  Sepsis due to Pneumonia vs SIRS from pancreatitis. With leukocytosis, fever, tachycardia PNA identified on CXR 5/9 but was not excluded on xray of 5/3 with bibasilar atelectasis vs infiltrate. --Sepsis physiology improving, continues to have fever which is possibly due to metabolic fever from pancreatitis. --Abx as above  Hyponatremia -fairly stable.  AKI Hypomagnesemia Hypokalema Hypocalcemia Non-anion gap metabolic acidosis Aggressively replaced and normalized.  Hypertension - continue amlodipine.  Stable.  Polysubstance abuse - motivated and determined to quit.  Last drink of EtOH now 4 weeks ago.   Patient BMI: Body mass index is 29.7 kg/m.   DVT prophylaxis: SCDs Start: 09/30/20 0121   Diet:  Diet Orders (From admission, onward)    Start     Ordered   10/09/20 1600  Diet clear liquid Room service appropriate? Yes; Fluid consistency: Thin  Diet effective now       Question Answer Comment  Room  service appropriate? Yes   Fluid consistency: Thin      10/09/20 1515            Code Status: Full Code    Subjective 10/10/20   Patient seen and examined.  His main  concern is bilateral flank pain, more on the left lower chest and flank.  Still has loose watery stool that looks black and tarry.  Denies any difficulty with eating liquids. T-max of 103 last 24 hours.   Disposition Plan & Communication   Status is: Inpatient  Remains inpatient appropriate because:IV treatments appropriate due to intensity of illness or inability to take PO   Dispo: The patient is from: Home              Anticipated d/c is to: Home              Patient currently is not medically stable to d/c.   Difficult to place patient No    Consults, Procedures, Significant Events   Consultants:   Gastroenterology  Procedures:   None  Antimicrobials:  Anti-infectives (From admission, onward)   Start     Dose/Rate Route Frequency Ordered Stop   10/07/20 1645  piperacillin-tazobactam (ZOSYN) IVPB 3.375 g        3.375 g 12.5 mL/hr over 240 Minutes Intravenous Every 8 hours 10/07/20 1557     10/07/20 1000  azithromycin (ZITHROMAX) tablet 500 mg  Status:  Discontinued        500 mg Oral Daily 10/07/20 0749 10/07/20 1542   10/07/20 0845  cefTRIAXone (ROCEPHIN) 2 g in sodium chloride 0.9 % 100 mL IVPB  Status:  Discontinued        2 g 200 mL/hr over 30 Minutes Intravenous Every 24 hours 10/07/20 0749 10/07/20 1542   10/06/20 1315  amoxicillin-clavulanate (AUGMENTIN) 875-125 MG per tablet 1 tablet  Status:  Discontinued        1 tablet Oral Every 12 hours 10/06/20 1227 10/07/20 0749        Micro  No positive data yet.  Objective   Vitals:   10/10/20 0343 10/10/20 0346 10/10/20 0453 10/10/20 0752  BP: (!) 150/86   136/85  Pulse: (!) 108  100 95  Resp: 20   20  Temp: (!) 102 F (38.9 C)  99.2 F (37.3 C) 98.5 F (36.9 C)  TempSrc: Oral  Oral Oral  SpO2: 95%   100%  Weight:  102.1 kg    Height:        Intake/Output Summary (Last 24 hours) at 10/10/2020 1007 Last data filed at 10/10/2020 0400 Gross per 24 hour  Intake 1338.47 ml  Output 500 ml  Net 838.47  ml   Filed Weights   10/09/20 0500 10/09/20 1321 10/10/20 0346  Weight: 102.4 kg 102.4 kg 102.1 kg    General: Looks comfortable on room air.  Anxious today.  Not in any distress. Cardiovascular: S1-S2 normal.  No murmurs.  Tachycardic. Respiratory: Mostly bilateral clear.  Decreased air entry on the left side. Gastrointestinal: Soft.  Distended and mildly tender all over.  Bowel sounds present.  No rigidity or guarding. Ext: No edema.  No cyanosis. Neuro: Alert oriented x4.  Equal strength in all extremities.  Labs   Data Reviewed: I have personally reviewed following labs and imaging studies  CBC: Recent Labs  Lab 10/05/20 0205 10/06/20 0440 10/07/20 0258 10/08/20 0441 10/09/20 0136 10/09/20 0257 10/09/20 1331  WBC 13.8* 18.0* 20.8* 17.8* 15.5*  --   --  NEUTROABS  --   --   --  15.5* 13.0*  --   --   HGB 11.1* 11.0* 10.3* 8.4* 6.3* 6.2* 7.8*  HCT 32.6* 33.1* 31.6* 25.3* 19.2* 18.9* 23.0*  MCV 96.2 96.2 97.5 96.6 97.5  --   --   PLT 162 198 210 208 213  --   --    Basic Metabolic Panel: Recent Labs  Lab 10/05/20 0205 10/06/20 0440 10/07/20 0258 10/08/20 0441 10/09/20 0136 10/09/20 1331 10/10/20 0432  NA 131* 131* 129* 131* 130* 131* 130*  K 3.9 4.1 4.2 3.8 3.8 3.9 3.9  CL 100 98 95* 95* 96* 98 96*  CO2 --  25  GLUCOSE 95 112* 105* 108* 138* 103* 154*  BUN 26* 18 14  CREATININE 0.93 0.90 0.96 0.94 0.97 0.80 1.02  CALCIUM 8.2* 8.1* 8.0* 8.0* 7.9*  --  7.9*  MG 1.9  --  1.9  --  2.0  --   --   PHOS 3.9  --   --   --  3.8  --   --    GFR: Estimated Creatinine Clearance: 134.2 mL/min (by C-G formula based on SCr of 1.02 mg/dL). Liver Function Tests: Recent Labs  Lab 10/05/20 0205 10/06/20 0440 10/09/20 0136 10/10/20 0432  AST 19 27 36 37  ALT 30 30 35 34  ALKPHOS 102 119 112 102  BILITOT 1.3* 1.2 0.4 0.7  PROT 5.6* 5.6* 5.3* 5.7*  ALBUMIN 2.2* 2.2* 1.9* 2.0*   Recent Labs  Lab 10/05/20 0205 10/08/20 0441  LIPASE 43  69*   No results for input(s): AMMONIA in the last 168 hours. Coagulation Profile: No results for input(s): INR, PROTIME in the last 168 hours. Cardiac Enzymes: No results for input(s): CKTOTAL, CKMB, CKMBINDEX, TROPONINI in the last 168 hours. BNP (last 3 results) No results for input(s): PROBNP in the last 8760 hours. HbA1C: No results for input(s): HGBA1C in the last 72 hours. CBG: Recent Labs  Lab 10/05/20 0523 10/05/20 0947 10/05/20 1244 10/05/20 1959 10/06/20 0331  GLUCAP 99 115* 124* 107* 114*   Lipid Profile: No results for input(s): CHOL, HDL, LDLCALC, TRIG, CHOLHDL, LDLDIRECT in the last 72 hours. Thyroid Function Tests: No results for input(s): TSH, T4TOTAL, FREET4, T3FREE, THYROIDAB in the last 72 hours. Anemia Panel: Recent Labs    10/09/20 0257  VITAMINB12 822  FOLATE 6.7  FERRITIN 1,806*  TIBC 209*  IRON 38*  RETICCTPCT 2.1   Sepsis Labs: Recent Labs  Lab 10/06/20 1448 10/07/20 0924 10/08/20 0441 10/09/20 0136  PROCALCITON 1.22 1.31 1.25 1.29  LATICACIDVEN 0.8  --   --   --     Recent Results (from the past 240 hour(s))  Culture, blood (routine x 2) Call MD if unable to obtain prior to antibiotics being given     Status: None (Preliminary result)   Collection Time: 10/07/20  9:24 AM   Specimen: BLOOD  Result Value Ref Range Status   Specimen Description BLOOD RIGHT ANTECUBITAL  Final   Special Requests   Final    BOTTLES DRAWN AEROBIC AND ANAEROBIC Blood Culture results may not be optimal due to an inadequate volume of blood received in culture bottles   Culture   Final    NO GROWTH 2 DAYS Performed at [facility], [address], [place], [place] [address]    Report Status PENDING  Incomplete  Culture, blood (routine x 2) Call  MD if unable to obtain prior to antibiotics being given     Status: None (Preliminary result)   Collection Time: 10/07/20  9:24 AM   Specimen: BLOOD RIGHT FOREARM  Result Value Ref Range Status   Specimen  Description BLOOD RIGHT FOREARM  Final   Special Requests   Final    BOTTLES DRAWN AEROBIC AND ANAEROBIC Blood Culture results may not be optimal due to an inadequate volume of blood received in culture bottles   Culture   Final    NO GROWTH 2 DAYS Performed at [facility], [address] [place], [place] [address]    Report Status PENDING  Incomplete  Resp Panel by RT-PCR (Flu A&B, Covid) Nasopharyngeal Swab     Status: None   Collection Time: 10/09/20 10:36 AM   Specimen: Nasopharyngeal Swab; Nasopharyngeal(NP) swabs in vial transport medium  Result Value Ref Range Status   SARS Coronavirus 2 by RT PCR NEGATIVE NEGATIVE Final    Comment: (NOTE) SARS-CoV-2 target nucleic acids are NOT DETECTED.  The SARS-CoV-2 RNA is generally detectable in upper respiratory specimens during the acute phase of infection. The lowest concentration of SARS-CoV-2 viral copies this assay can detect is 138 copies/mL. A negative result does not preclude SARS-Cov-2 infection and should not be used as the sole basis for treatment or other patient management decisions. A negative result may occur with  improper specimen collection/handling, submission of specimen other than nasopharyngeal swab, presence of viral mutation(s) within the areas targeted by this assay, and inadequate number of viral copies(<138 copies/mL). A negative result must be combined with clinical observations, patient history, and epidemiological information. The expected result is Negative.  Fact Sheet for Patients:  BloggerCourse.com  Fact Sheet for Healthcare Providers:  SeriousBroker.it  This test is no t yet approved or cleared by the [place] FDA and  has been authorized for detection and/or diagnosis of SARS-CoV-2 by FDA under an Emergency Use Authorization (EUA). This EUA will remain  in effect (meaning this test can be used) for the duration of the COVID-19  declaration under Section 564(b)(1) of the Act, 21 U.S.C.section 360bbb-3(b)(1), unless the authorization is terminated  or revoked sooner.       Influenza A by PCR NEGATIVE NEGATIVE Final   Influenza B by PCR NEGATIVE NEGATIVE Final    Comment: (NOTE) The Xpert Xpress SARS-CoV-2/FLU/RSV plus assay is intended as an aid in the diagnosis of influenza from Nasopharyngeal swab specimens and should not be used as a sole basis for treatment. Nasal washings and aspirates are unacceptable for Xpert Xpress SARS-CoV-2/FLU/RSV testing.  Fact Sheet for Patients: BloggerCourse.com  Fact Sheet for Healthcare Providers: SeriousBroker.it  This test is not yet approved or cleared by the [place] FDA and has been authorized for detection and/or diagnosis of SARS-CoV-2 by FDA under an Emergency Use Authorization (EUA). This EUA will remain in effect (meaning this test can be used) for the duration of the COVID-19 declaration under Section 564(b)(1) of the Act, 21 U.S.C. section 360bbb-3(b)(1), unless the authorization is terminated or revoked.  Performed at [facility], [address], [place], [place] [address]       Imaging Studies   No results found.   Medications   Scheduled Meds: . sodium chloride   Intravenous Once  . amLODipine  10 mg Oral Daily  . Chlorhexidine Gluconate Cloth  6 each Topical Q0600  . fenofibrate  54 mg Oral Daily  . lidocaine      .  pantoprazole (PROTONIX) IV  40 mg Intravenous Q12H  . thiamine  100 mg Oral Daily   Continuous Infusions: . sodium chloride Stopped (10/01/20 1059)  . sodium chloride 50 mL/hr at 10/08/20 0945  . octreotide  (SANDOSTATIN)    IV infusion 50 mcg/hr (10/10/20 0221)  . ondansetron (ZOFRAN) IV Stopped (09/30/20 2014)  . piperacillin-tazobactam (ZOSYN)  IV 3.375 g (10/10/20 0103)       LOS: 11 days    Time spent: 30 minutes    Dorcas Carrow, MD [facility]  10/10/2020, 10:07 AM

## 2020-10-10 NOTE — Progress Notes (Addendum)
Daily Rounding Note  10/10/2020, 11:46 AM  LOS: 11 days   SUBJECTIVE:   Chief complaint:   Acute pancreatitis. UGI bleed.   Still has pain in left upper quadrant but it is not severe.  Tolerating full liquids.  Had bowel movements yesterday.  Breathing better after paracentesis this morning  OBJECTIVE:         Vital signs in last 24 hours:    Temp:  [98.5 F (36.9 C)-103.3 F (39.6 C)] 98.5 F (36.9 C) (05/13 0752) Pulse Rate:  [95-123] 95 (05/13 0752) Resp:  [18-29] 20 (05/13 0752) BP: (126-156)/(75-92) 136/85 (05/13 0752) SpO2:  [94 %-100 %] 100 % (05/13 0752) Weight:  [102.1 kg-102.4 kg] 102.1 kg (05/13 0346) Last BM Date: 10/09/20 Filed Weights   10/09/20 0500 10/09/20 1321 10/10/20 0346  Weight: 102.4 kg 102.4 kg 102.1 kg   General: Comfortable.  Does not look toxic or acutely ill, just moderately ill-appearing at most. Heart: RRR. Chest: Diminished breath sounds on the left.  No labored breathing or cough Abdomen: Tense.  Active bowel sounds.  Tenderness without guarding or rebound in the left mid/upper abdomen. Extremities: No CCE. Neuro/Psych: Does not, calm, alert.  No gross deficits, tremors or weakness.  Intake/Output from previous day: 05/12 0701 - 05/13 0700 In: 1604.5 [I.V.:771.6; Blood:609.8; IV Piggyback:223.1] Out: 500 [Urine:500]  Intake/Output this shift: No intake/output data recorded.  Lab Results: Recent Labs    10/08/20 0441 10/09/20 0136 10/09/20 0257 10/09/20 1331  WBC 17.8* 15.5*  --   --   HGB 8.4* 6.3* 6.2* 7.8*  HCT 25.3* 19.2* 18.9* 23.0*  PLT 208 213  --   --    BMET Recent Labs    10/08/20 0441 10/09/20 0136 10/09/20 1331 10/10/20 0432  NA 131* 130* 131* 130*  K 3.8 3.8 3.9 3.9  CL 95* 96* 98 96*  CO2 25 25  --  25  GLUCOSE 108* 138* 103* 154*  BUN 18 26* 18 14  CREATININE 0.94 0.97 0.80 1.02  CALCIUM 8.0* 7.9*  --  7.9*   LFT Recent Labs     10/09/20 0136 10/10/20 0432  PROT 5.3* 5.7*  ALBUMIN 1.9* 2.0*  AST 36 37  ALT 35 34  ALKPHOS 112 102  BILITOT 0.4 0.7    Studies/Results: DG Chest 1 View  Result Date: 10/10/2020 CLINICAL DATA:  Post thoracentesis EXAM: CHEST  1 VIEW COMPARISON:  Chest x-ray 10/06/2020.  Chest CT 10/07/2020. FINDINGS: Worsening moderate left pleural effusion and airspace disease in the left lower lobe. Very low lung volumes. No pneumothorax following thoracentesis. Heart size is accentuated by the low volumes. IMPRESSION: Worsening aeration/lung volumes with increasing left effusion and left lower lobe airspace disease. No pneumothorax. Electronically Signed   By: Charlett Nose M.D.   On: 10/10/2020 10:45   Scheduled Meds: . sodium chloride   Intravenous Once  . amLODipine  10 mg Oral Daily  . Chlorhexidine Gluconate Cloth  6 each Topical Q0600  . fenofibrate  54 mg Oral Daily  . lidocaine      . pantoprazole (PROTONIX) IV  40 mg Intravenous Q12H  . thiamine  100 mg Oral Daily   Continuous Infusions: . sodium chloride Stopped (10/01/20 1059)  . sodium chloride 50 mL/hr at 10/08/20 0945  . octreotide  (SANDOSTATIN)    IV infusion 50 mcg/hr (10/10/20 0221)  . ondansetron (ZOFRAN) IV Stopped (09/30/20 2014)  . piperacillin-tazobactam (ZOSYN)  IV 3.375 g (  10/10/20 1051)   PRN Meds:.sodium chloride, acetaminophen, docusate sodium, hydrALAZINE, HYDROmorphone (DILAUDID) injection, labetalol, LORazepam, ondansetron (ZOFRAN) IV, oxyCODONE   ASSESMENT:   *   Melena, tarry stools. 10/09/2020 EGD: Grade 2, nonbleeding esophageal varices eradicated with band ligation x 3.  Extrinsic compression on stomach (from large perigastric fluid collection, likely evolving pseudocyst).  Duodenal erosions. Source of bleed not clear, either friable erosive duodenitis or esophageal varices. Gastric compression severe enough that it may interfere with food intake. Protonix 40 mg IV bid and 72 hours octreotide drip in  place.  *   Acute blood loss anemia. 2 PRBCs.  Hgb 6.2 >> 7.8.    *   Acute pancreatitis, recurrent. Alcohol vs hypertriglyceridemia. Second bout of pancreatitis. CT chest, abdomen, pelvis 5/10 showed pancreatitis.  Pancreatic fluid collections, perigastric fluid collection all likely evolving pseudocysts.  SVT with venous collateralization.  Edema/inflammation causing narrowing of portal vein but PV patent.  Wall thickening in the transverse, splenic and proximal descending colon with surrounding ill-defined fluid attenuation tracking from the pancreas.  Small ascites.  Left lower lobe collapse/consolidation and pleural effusion.  Heterogeneous perfusion in the left liver, likely accentuated by periportal edema.  Sludge versus vicarious excretion of contrast (from prior imaging) in gallbladder.  *   Hypertriglyceridemia, improved.  Not on meds.  *   Left pleural effusion.  S/p thoracentesis earlier today.  No reports yet as to volume removed and all fluid studies are processing. Breathing improved.   PLAN   *   Had a long conversation with Dr. Loreta Ave in radiology regarding imaging.  He does not feel that an MRI is going to provide any additional, beneficial information.  Did not feel it would be necessary to perform Doppler studies.  Suggested follow-up CT scan.  Sooner if clinically not doing well, in a week or more to follow-up pancreatitis.  *   Patient is now off all DVT prophylaxis blood thinner so we will order PAS hose.  Encouraged patient to walk in the hallway which she has been doing.   Dennis [name]  10/10/2020, 11:46 AM Phone [phone]    Attending Physician Note   I have taken an interval history, reviewed the chart and examined the patient. I agree with the Advanced Practitioner's note, impression and recommendations.   Melena secondary to erosive duodenitis or esophageal varices. Continue octreotide IV for 3 day course. Continue pantoprazole IV bid and transition to PO  bid in a few days. No clear evidence of cirrhosis on imaging suggesting non cirrhotic portal hypertension. Portal vein is narrow but patent. No additional imaging recommended by radiology at this time. Recommend follow up CT about 1 week from last CT, 5/16 or 5/17.   ABL anemia. Hgb improved to 7.8 post transfusion. Trend CBC.   Acute pancreatitis, alcohol vs hypertriglyceridemia. Evolving pseudocysts with one causing severe extrinsic gastric fundus and body compression. Continue full liquids today. Would advance to a soft diet very gradually as tolerated.    Claudette Head, MD FACG [phone]

## 2020-10-10 NOTE — Progress Notes (Signed)
Pharmacy Antibiotic Note  Dennis [name] is a 30 y.o. male admitted on 09/29/2020 with abdominal pain. Pharmacy has been consulted for Zosyn dosing with concerns for abdominal infection.   SCr 1.02, CrCl~100 ml/min. Current dose remains appropriate, D#4  Plan: - Continue Zosyn EI 3.375g IV q8h - Will continue to follow renal function, culture results, LOT, and antibiotic de-escalation plans   Height: 6\' 1"  (185.4 cm) Weight: 102.1 kg (225 lb 1.4 oz) IBW/kg (Calculated) : 79.9  Temp (24hrs), Avg:100.7 F (38.2 C), Min:98.5 F (36.9 C), Max:103.3 F (39.6 C)  Recent Labs  Lab 10/05/20 0205 10/06/20 0440 10/06/20 1448 10/07/20 0258 10/08/20 0441 10/09/20 0136 10/09/20 1331 10/10/20 0432  WBC 13.8* 18.0*  --  20.8* 17.8* 15.5*  --   --   CREATININE 0.93 0.90  --  0.96 0.94 0.97 0.80 1.02  LATICACIDVEN  --   --  0.8  --   --   --   --   --     Estimated Creatinine Clearance: 134.2 mL/min (by C-G formula based on SCr of 1.02 mg/dL).    No Known Allergies  Thank you for allowing pharmacy to be a part of this patient's care.  10/12/20, PharmD, BCPS Clinical Pharmacist Clinical phone for 10/10/2020: [phone] 10/10/2020 1:03 PM   **Pharmacist phone directory can now be found on [email or web address] ([email or web address]).  Listed under [facility].

## 2020-10-10 NOTE — Procedures (Addendum)
Ultrasound-guided diagnostic and therapeutic left sided thoracentesis performed yielding 500 mililiters of amber colored fluid. No immediate complications.   Diagnostic fluid was sent to the lab for further analysis. Follow-up chest x-ray pending. EBL is < 2 ml. Patient unable to tolerate additional fluid removal.

## 2020-10-11 ENCOUNTER — Inpatient Hospital Stay (HOSPITAL_COMMUNITY): Payer: Medicaid Other

## 2020-10-11 LAB — CBC WITH DIFFERENTIAL/PLATELET
Abs Immature Granulocytes: 0.83 10*3/uL — ABNORMAL HIGH (ref 0.00–0.07)
Basophils Absolute: 0 10*3/uL (ref 0.0–0.1)
Basophils Relative: 0 %
Eosinophils Absolute: 0.1 10*3/uL (ref 0.0–0.5)
Eosinophils Relative: 0 %
HCT: 21.7 % — ABNORMAL LOW (ref 39.0–52.0)
Hemoglobin: 7.2 g/dL — ABNORMAL LOW (ref 13.0–17.0)
Immature Granulocytes: 5 %
Lymphocytes Relative: 12 %
Lymphs Abs: 2 10*3/uL (ref 0.7–4.0)
MCH: 32 pg (ref 26.0–34.0)
MCHC: 33.2 g/dL (ref 30.0–36.0)
MCV: 96.4 fL (ref 80.0–100.0)
Monocytes Absolute: 1.3 10*3/uL — ABNORMAL HIGH (ref 0.1–1.0)
Monocytes Relative: 8 %
Neutro Abs: 12.8 10*3/uL — ABNORMAL HIGH (ref 1.7–7.7)
Neutrophils Relative %: 75 %
Platelets: 224 10*3/uL (ref 150–400)
RBC: 2.25 MIL/uL — ABNORMAL LOW (ref 4.22–5.81)
RDW: 14.7 % (ref 11.5–15.5)
WBC: 17.1 10*3/uL — ABNORMAL HIGH (ref 4.0–10.5)
nRBC: 0.4 % — ABNORMAL HIGH (ref 0.0–0.2)

## 2020-10-11 LAB — COMPREHENSIVE METABOLIC PANEL
ALT: 29 U/L (ref 0–44)
AST: 32 U/L (ref 15–41)
Albumin: 1.9 g/dL — ABNORMAL LOW (ref 3.5–5.0)
Alkaline Phosphatase: 86 U/L (ref 38–126)
Anion gap: 8 (ref 5–15)
BUN: 11 mg/dL (ref 6–20)
CO2: 28 mmol/L (ref 22–32)
Calcium: 7.9 mg/dL — ABNORMAL LOW (ref 8.9–10.3)
Chloride: 96 mmol/L — ABNORMAL LOW (ref 98–111)
Creatinine, Ser: 1.03 mg/dL (ref 0.61–1.24)
GFR, Estimated: >60 mL/min (ref >60)
Glucose, Bld: 113 mg/dL — ABNORMAL HIGH (ref 70–99)
Potassium: 4.2 mmol/L (ref 3.5–5.1)
Sodium: 132 mmol/L — ABNORMAL LOW (ref 135–145)
Total Bilirubin: 0.8 mg/dL (ref 0.3–1.2)
Total Protein: 5.6 g/dL — ABNORMAL LOW (ref 6.5–8.1)

## 2020-10-11 LAB — MAGNESIUM: Magnesium: 2 mg/dL (ref 1.7–2.4)

## 2020-10-11 MED ORDER — BARIUM SULFATE 2.1 % PO SUSP
ORAL | Status: AC
  Filled 2020-10-11: qty 2

## 2020-10-11 NOTE — Progress Notes (Signed)
PROGRESS NOTE    Dennis [name]   UJW:[id number]  DOB: May 22, 1991  PCP: No primary care provider on file.    DOA: 09/29/2020 LOS: 14   Brief Narrative   30 year old male with history of EtOH, tobacco and marijuana use, remote seizures came into the hospital with abdominal pain and diagnosed with acute pancreatitis.  2 weeks ago he was admitted to [facility] ED, diagnosed with alcoholic pancreatitis and discharged home.  He has not had any alcohol since.  His abdominal pain recurred and came back to the hospital, he was found to have triglycerides of 2400 and was sent to [facility], ICU to be evaluated for possible Plex for hypertriglyceridemia.  Symptomatically improved with insulin and dextrose infusion.  Remains in the hospital with persistent pain, development of pseudocyst. Patient also developed left lower lobe pneumonia. 5/11, continues to drop hemoglobin.  Black tarry stool. 5/12, underwent upper GI endoscopy with no active bleeding esophageal varices, large pseudocyst compressing on gastric fundus. Continues to remain febrile.    Assessment & Plan   Principal Problem:   Acute pancreatitis Active Problems:   Hypertriglyceridemia   AKI (acute kidney injury) (HCC)   Leukocytosis   Abnormal CT of the abdomen   Melena   Acute blood loss anemia   Esophageal varices without bleeding (HCC)  Acute blood loss anemia:  Hemoglobin 11-6.2 in 1 week.  FOBT positive.   PRBC 2 units 5/12 with appropriate response to hemoglobin.  Back down to 7.2.  We will recheck tomorrow morning and transfuse if less than 7. Upper GI endoscopy 5/12 with esophageal varices banded with good results, no other active bleeding ulcers.  Symptomatic treatment. We will continue IV Protonix. Started on octreotide bolus and infusion, continue for 72 hours until Monday.  Acute pancreatitis due to EtOH and hypertriglyceridemia - initially admitted to ICU, treated with insulin and dextrose infusion, now  off.  TG's now 295 (less than 500 since 5/4). --Supportive care: IVF's (reduced to 46ml/hr), pain control --Soft diet.  Tolerating meal. --CT chest/abdomen/pelvis with multiple pancreatic cyst, 10 cm pseudocyst with some compression on the stomach.  Seen by gastroenterology, suggested symptomatic treatment and 6 weeks follow-up. -- Start on low-dose fenofibrate, earlier stopped due to abdominal pain which could be related to primary symptom.  Tolerating. -- Advance to soft diet today as he is tolerating full liquid diet.  Left lower lobe pneumonia - CXR obtained 5/9 due to worsened leukocytosis and diminished lung bases on exam, showed LLL infiltrate and small pleural effusion.  CT scan with left lower lobe collapse/consolidation with some pleural effusion. --Currently on Zosyn that we will continue. --Mobility and aggressive chest physiotherapy. -- Thoracentesis 500 mm on 5/13 left pleural space, no evidence of infection. -- Due to persistent fever, will check CT scan of the chest abdomen pelvis to look for any complications of pancreatitis, abscesses. Will use oral contrast only because of shortage of IV contrast.  Sepsis due to Pneumonia vs SIRS from pancreatitis. With leukocytosis, fever, tachycardia PNA identified on CXR 5/9 but was not excluded on xray of 5/3 with bibasilar atelectasis vs infiltrate. --Sepsis physiology improving, continues to have fever which is possibly due to metabolic fever from pancreatitis.  Hypertension - continue amlodipine.  Stable.  Polysubstance abuse - motivated and determined to quit.  Last drink of EtOH now 4 weeks ago.   Patient BMI: Body mass index is 29.7 kg/m.   DVT prophylaxis: Place and maintain sequential compression device Start: 10/10/20 1238 SCDs Start:  09/30/20 0121   Diet:  Diet Orders (From admission, onward)    Start     Ordered   10/11/20 1110  DIET SOFT Room service appropriate? Yes; Fluid consistency: Thin  Diet effective now        Question Answer Comment  Room service appropriate? Yes   Fluid consistency: Thin      10/11/20 1109            Code Status: Full Code    Subjective 10/11/20   Seen and examined.  Breathing better.  Abdominal pain is better still distended.  He denies any nausea vomiting.  He was able to take full liquid diet without trouble.  He had 1 loose bowel movement overnight which was lighter but darker color.  Unlike melanotic stools in the past. He is wondering about going home. Temperature 103 maximum last 24 hours.   Disposition Plan & Communication   Status is: Inpatient  Remains inpatient appropriate because:IV treatments appropriate due to intensity of illness or inability to take PO   Dispo: The patient is from: Home              Anticipated d/c is to: Home              Patient currently is not medically stable to d/c.   Difficult to place patient No    Consults, Procedures, Significant Events   Consultants:   Gastroenterology  Procedures:   None  Antimicrobials:  Anti-infectives (From admission, onward)   Start     Dose/Rate Route Frequency Ordered Stop   10/07/20 1645  piperacillin-tazobactam (ZOSYN) IVPB 3.375 g        3.375 g 12.5 mL/hr over 240 Minutes Intravenous Every 8 hours 10/07/20 1557     10/07/20 1000  azithromycin (ZITHROMAX) tablet 500 mg  Status:  Discontinued        500 mg Oral Daily 10/07/20 0749 10/07/20 1542   10/07/20 0845  cefTRIAXone (ROCEPHIN) 2 g in sodium chloride 0.9 % 100 mL IVPB  Status:  Discontinued        2 g 200 mL/hr over 30 Minutes Intravenous Every 24 hours 10/07/20 0749 10/07/20 1542   10/06/20 1315  amoxicillin-clavulanate (AUGMENTIN) 875-125 MG per tablet 1 tablet  Status:  Discontinued        1 tablet Oral Every 12 hours 10/06/20 1227 10/07/20 0749        Micro  No positive data yet.  Objective   Vitals:   10/11/20 0500 10/11/20 0638 10/11/20 0750 10/11/20 0934  BP:   118/79 125/77  Pulse: (!) 103 95 97 (!)  104  Resp:   15 18  Temp:  99.1 F (37.3 C) 99.7 F (37.6 C) (!) 100.4 F (38 C)  TempSrc:   Oral Oral  SpO2: 92% 93% 96% 95%  Weight:      Height:        Intake/Output Summary (Last 24 hours) at 10/11/2020 1109 Last data filed at 10/11/2020 0634 Gross per 24 hour  Intake 1461.75 ml  Output 1100 ml  Net 361.75 ml   Filed Weights   10/09/20 0500 10/09/20 1321 10/10/20 0346  Weight: 102.4 kg 102.4 kg 102.1 kg    General: Looks comfortable on room air.  Not in any distress. Cardiovascular: S1-S2 normal.  No murmurs.  Tachycardic. Respiratory: Mostly bilateral clear.  Decreased air entry on the left side. Gastrointestinal: Soft.  Distended and mildly tender all over.  Bowel sounds present.  No rigidity or  guarding. Ext: No edema.  No cyanosis. Neuro: Alert oriented x4.  Equal strength in all extremities.  Labs   Data Reviewed: I have personally reviewed following labs and imaging studies  CBC: Recent Labs  Lab 10/06/20 0440 10/07/20 0258 10/08/20 0441 10/09/20 0136 10/09/20 0257 10/09/20 1331 10/11/20 0358  WBC 18.0* 20.8* 17.8* 15.5*  --   --  17.1*  NEUTROABS  --   --  15.5* 13.0*  --   --  12.8*  HGB 11.0* 10.3* 8.4* 6.3* 6.2* 7.8* 7.2*  HCT 33.1* 31.6* 25.3* 19.2* 18.9* 23.0* 21.7*  MCV 96.2 97.5 96.6 97.5  --   --  96.4  PLT 198 210 208 213  --   --  224   Basic Metabolic Panel: Recent Labs  Lab 10/05/20 0205 10/06/20 0440 10/07/20 0258 10/08/20 0441 10/09/20 0136 10/09/20 1331 10/10/20 0432 10/11/20 0358  NA 131*   < > 129* 131* 130* 131* 130* 132*  K 3.9   < > 4.2 3.8 3.8 3.9 3.9 4.2  CL 100   < > 95* 95* 96* 98 96* 96*  CO2 23   < > 22 25 25   --  25 28  GLUCOSE 95   < > 105* 108* 138* 103* 154* 113*  BUN 14   < > 18 18 26* 18 14 11   CREATININE 0.93   < > 0.96 0.94 0.97 0.80 1.02 1.03  CALCIUM 8.2*   < > 8.0* 8.0* 7.9*  --  7.9* 7.9*  MG 1.9  --  1.9  --  2.0  --   --  2.0  PHOS 3.9  --   --   --  3.8  --   --   --    < > = values in this  interval not displayed.   GFR: Estimated Creatinine Clearance: 132.9 mL/min (by C-G formula based on SCr of 1.03 mg/dL). Liver Function Tests: Recent Labs  Lab 10/05/20 0205 10/06/20 0440 10/09/20 0136 10/10/20 0432 10/11/20 0358  AST 19 27 36 37 32  ALT 30 30 35 34 29  ALKPHOS 102 119 112 102 86  BILITOT 1.3* 1.2 0.4 0.7 0.8  PROT 5.6* 5.6* 5.3* 5.7* 5.6*  ALBUMIN 2.2* 2.2* 1.9* 2.0* 1.9*   Recent Labs  Lab 10/05/20 0205 10/08/20 0441  LIPASE 43 69*   No results for input(s): AMMONIA in the last 168 hours. Coagulation Profile: No results for input(s): INR, PROTIME in the last 168 hours. Cardiac Enzymes: No results for input(s): CKTOTAL, CKMB, CKMBINDEX, TROPONINI in the last 168 hours. BNP (last 3 results) No results for input(s): PROBNP in the last 8760 hours. HbA1C: No results for input(s): HGBA1C in the last 72 hours. CBG: Recent Labs  Lab 10/05/20 0523 10/05/20 0947 10/05/20 1244 10/05/20 1959 10/06/20 0331  GLUCAP 99 115* 124* 107* 114*   Lipid Profile: No results for input(s): CHOL, HDL, LDLCALC, TRIG, CHOLHDL, LDLDIRECT in the last 72 hours. Thyroid Function Tests: No results for input(s): TSH, T4TOTAL, FREET4, T3FREE, THYROIDAB in the last 72 hours. Anemia Panel: Recent Labs    10/09/20 0257  VITAMINB12 822  FOLATE 6.7  FERRITIN 1,806*  TIBC 209*  IRON 38*  RETICCTPCT 2.1   Sepsis Labs: Recent Labs  Lab 10/06/20 1448 10/07/20 0924 10/08/20 0441 10/09/20 0136  PROCALCITON 1.22 1.31 1.25 1.29  LATICACIDVEN 0.8  --   --   --     Recent Results (from the past 240 hour(s))  Culture, blood (routine x  2) Call MD if unable to obtain prior to antibiotics being given     Status: None (Preliminary result)   Collection Time: 10/07/20  9:24 AM   Specimen: BLOOD  Result Value Ref Range Status   Specimen Description BLOOD RIGHT ANTECUBITAL  Final   Special Requests   Final    BOTTLES DRAWN AEROBIC AND ANAEROBIC Blood Culture results may not be  optimal due to an inadequate volume of blood received in culture bottles   Culture   Final    NO GROWTH 3 DAYS Performed at [facility], [address] [place], [place] [address]    Report Status PENDING  Incomplete  Culture, blood (routine x 2) Call MD if unable to obtain prior to antibiotics being given     Status: None (Preliminary result)   Collection Time: 10/07/20  9:24 AM   Specimen: BLOOD RIGHT FOREARM  Result Value Ref Range Status   Specimen Description BLOOD RIGHT FOREARM  Final   Special Requests   Final    BOTTLES DRAWN AEROBIC AND ANAEROBIC Blood Culture results may not be optimal due to an inadequate volume of blood received in culture bottles   Culture   Final    NO GROWTH 3 DAYS Performed at [facility], [address], [place], [place] [address]    Report Status PENDING  Incomplete  Resp Panel by RT-PCR (Flu A&B, Covid) Nasopharyngeal Swab     Status: None   Collection Time: 10/09/20 10:36 AM   Specimen: Nasopharyngeal Swab; Nasopharyngeal(NP) swabs in vial transport medium  Result Value Ref Range Status   SARS Coronavirus 2 by RT PCR NEGATIVE NEGATIVE Final    Comment: (NOTE) SARS-CoV-2 target nucleic acids are NOT DETECTED.  The SARS-CoV-2 RNA is generally detectable in upper respiratory specimens during the acute phase of infection. The lowest concentration of SARS-CoV-2 viral copies this assay can detect is 138 copies/mL. A negative result does not preclude SARS-Cov-2 infection and should not be used as the sole basis for treatment or other patient management decisions. A negative result may occur with  improper specimen collection/handling, submission of specimen other than nasopharyngeal swab, presence of viral mutation(s) within the areas targeted by this assay, and inadequate number of viral copies(<138 copies/mL). A negative result must be combined with clinical observations, patient history, and epidemiological information. The expected  result is Negative.  Fact Sheet for Patients:  BloggerCourse.com  Fact Sheet for Healthcare Providers:  SeriousBroker.it  This test is no t yet approved or cleared by the [place] FDA and  has been authorized for detection and/or diagnosis of SARS-CoV-2 by FDA under an Emergency Use Authorization (EUA). This EUA will remain  in effect (meaning this test can be used) for the duration of the COVID-19 declaration under Section 564(b)(1) of the Act, 21 U.S.C.section 360bbb-3(b)(1), unless the authorization is terminated  or revoked sooner.       Influenza A by PCR NEGATIVE NEGATIVE Final   Influenza B by PCR NEGATIVE NEGATIVE Final    Comment: (NOTE) The Xpert Xpress SARS-CoV-2/FLU/RSV plus assay is intended as an aid in the diagnosis of influenza from Nasopharyngeal swab specimens and should not be used as a sole basis for treatment. Nasal washings and aspirates are unacceptable for Xpert Xpress SARS-CoV-2/FLU/RSV testing.  Fact Sheet for Patients: BloggerCourse.com  Fact Sheet for Healthcare Providers: SeriousBroker.it  This test is not yet approved or cleared by the [place] FDA and has been authorized for detection and/or diagnosis  of SARS-CoV-2 by FDA under an Emergency Use Authorization (EUA). This EUA will remain in effect (meaning this test can be used) for the duration of the COVID-19 declaration under Section 564(b)(1) of the Act, 21 U.S.C. section 360bbb-3(b)(1), unless the authorization is terminated or revoked.  Performed at [facility], [address] [place], [place] [address]   Body fluid culture w Gram Stain     Status: None (Preliminary result)   Collection Time: 10/10/20 10:37 AM   Specimen: Lung, Left; Pleural Fluid  Result Value Ref Range Status   Specimen Description PLEURAL FLUID  Final   Special Requests LUNG LEFT  Final   Gram Stain    Final    FEW WBC PRESENT,BOTH PMN AND MONONUCLEAR NO ORGANISMS SEEN    Culture   Final    NO GROWTH < 24 HOURS Performed at [facility], [address], [place], [place] [address]    Report Status PENDING  Incomplete      Imaging Studies   DG Chest 1 View  Result Date: 10/10/2020 CLINICAL DATA:  Post thoracentesis EXAM: CHEST  1 VIEW COMPARISON:  Chest x-ray 10/06/2020.  Chest CT 10/07/2020. FINDINGS: Worsening moderate left pleural effusion and airspace disease in the left lower lobe. Very low lung volumes. No pneumothorax following thoracentesis. Heart size is accentuated by the low volumes. IMPRESSION: Worsening aeration/lung volumes with increasing left effusion and left lower lobe airspace disease. No pneumothorax. Electronically Signed   By: Charlett Nose M.D.   On: 10/10/2020 10:45   IR THORACENTESIS ASP PLEURAL SPACE W/IMG GUIDE  Result Date: 10/10/2020 INDICATION: History of pancreatitis found to have pneumonia with left-sided pleural effusion. Request is for therapeutic and diagnostic thoracentesis EXAM: ULTRASOUND GUIDED THERAPEUTIC AND DIAGNOSTIC LEFT-SIDED THORACENTESIS MEDICATIONS: Lidocaine 1% 10 mL COMPLICATIONS: None immediate. PROCEDURE: An ultrasound guided thoracentesis was thoroughly discussed with the patient and questions answered. The benefits, risks, alternatives and complications were also discussed. The patient understands and wishes to proceed with the procedure. Written consent was obtained. Ultrasound was performed to localize and mark an adequate pocket of fluid in the left chest. The area was then prepped and draped in the normal sterile fashion. 1% Lidocaine was used for local anesthesia. Under ultrasound guidance a 6 Fr Safe-T-Centesis catheter was introduced. Thoracentesis was performed. The catheter was removed and a dressing applied. Patient unable to tolerate additional fluid removal at this time. FINDINGS: A total of approximately 500 mL of amber  colored fluid was removed. Samples were sent to the laboratory as requested by the clinical team. IMPRESSION: Successful ultrasound guided therapeutic and diagnostic left-sided thoracentesis yielding 500 mL of pleural fluid. Read by: Anders Grant, NP Electronically Signed   By: Simonne Come M.D.   On: 10/10/2020 11:16     Medications   Scheduled Meds: . sodium chloride   Intravenous Once  . amLODipine  10 mg Oral Daily  . Barium Sulfate      . Chlorhexidine Gluconate Cloth  6 each Topical Q0600  . fenofibrate  54 mg Oral Daily  . pantoprazole (PROTONIX) IV  40 mg Intravenous Q12H  . thiamine  100 mg Oral Daily   Continuous Infusions: . sodium chloride Stopped (10/01/20 1059)  . sodium chloride 50 mL/hr at 10/11/20 0634  . octreotide  (SANDOSTATIN)    IV infusion 50 mcg/hr (10/10/20 2327)  . ondansetron (ZOFRAN) IV Stopped (09/30/20 2014)  . piperacillin-tazobactam (ZOSYN)  IV 3.375 g (10/11/20 0956)  LOS: 12 days    Time spent: 30 minutes    Dorcas Carrow, MD [facility]  10/11/2020, 11:09 AM

## 2020-10-11 NOTE — Progress Notes (Signed)
Progress Note for [facility]  Subjective: No complaints.  He reports passing flatus and a bowel movement.  Objective: Vital signs in last 24 hours: Temp:  [98.5 F (36.9 C)-103 F (39.4 C)] 99.1 F (37.3 C) (05/14 7619) Pulse Rate:  [95-115] 95 (05/14 0638) Resp:  [15-21] 15 (05/14 0352) BP: (123-136)/(75-85) 130/81 (05/14 0352) SpO2:  [92 %-100 %] 93 % (05/14 5093) Last BM Date: 10/09/20  Intake/Output from previous day: 05/13 0701 - 05/14 0700 In: 1461.8 [P.O.:360; I.V.:938.4; IV Piggyback:163.4] Out: 1100 [Urine:1100] Intake/Output this shift: No intake/output data recorded.  General appearance: alert and no distress GI: distended, moderately tense, tympanic  Lab Results: Recent Labs    10/09/20 0136 10/09/20 0257 10/09/20 1331 10/11/20 0358  WBC 15.5*  --   --  17.1*  HGB 6.3* 6.2* 7.8* 7.2*  HCT 19.2* 18.9* 23.0* 21.7*  PLT 213  --   --  224   BMET Recent Labs    10/09/20 0136 10/09/20 1331 10/10/20 0432 10/11/20 0358  NA 130* 131* 130* 132*  K 3.8 3.9 3.9 4.2  CL 96* 98 96* 96*  CO2 25  --  25 28  GLUCOSE 138* 103* 154* 113*  BUN 26* 18 14 11   CREATININE 0.97 0.80 1.02 1.03  CALCIUM 7.9*  --  7.9* 7.9*   LFT Recent Labs    10/11/20 0358  PROT 5.6*  ALBUMIN 1.9*  AST 32  ALT 29  ALKPHOS 86  BILITOT 0.8   PT/INR No results for input(s): LABPROT, INR in the last 72 hours. Hepatitis Panel No results for input(s): HEPBSAG, HCVAB, HEPAIGM, HEPBIGM in the last 72 hours. C-Diff No results for input(s): CDIFFTOX in the last 72 hours. Fecal Lactopherrin No results for input(s): FECLLACTOFRN in the last 72 hours.  Studies/Results: DG Chest 1 View  Result Date: 10/10/2020 CLINICAL DATA:  Post thoracentesis EXAM: CHEST  1 VIEW COMPARISON:  Chest x-ray 10/06/2020.  Chest CT 10/07/2020. FINDINGS: Worsening moderate left pleural effusion and airspace disease in the left lower lobe. Very low lung volumes. No pneumothorax following thoracentesis.  Heart size is accentuated by the low volumes. IMPRESSION: Worsening aeration/lung volumes with increasing left effusion and left lower lobe airspace disease. No pneumothorax. Electronically Signed   By: 12/07/2020 M.D.   On: 10/10/2020 10:45   IR THORACENTESIS ASP PLEURAL SPACE W/IMG GUIDE  Result Date: 10/10/2020 INDICATION: History of pancreatitis found to have pneumonia with left-sided pleural effusion. Request is for therapeutic and diagnostic thoracentesis EXAM: ULTRASOUND GUIDED THERAPEUTIC AND DIAGNOSTIC LEFT-SIDED THORACENTESIS MEDICATIONS: Lidocaine 1% 10 mL COMPLICATIONS: None immediate. PROCEDURE: An ultrasound guided thoracentesis was thoroughly discussed with the patient and questions answered. The benefits, risks, alternatives and complications were also discussed. The patient understands and wishes to proceed with the procedure. Written consent was obtained. Ultrasound was performed to localize and mark an adequate pocket of fluid in the left chest. The area was then prepped and draped in the normal sterile fashion. 1% Lidocaine was used for local anesthesia. Under ultrasound guidance a 6 Fr Safe-T-Centesis catheter was introduced. Thoracentesis was performed. The catheter was removed and a dressing applied. Patient unable to tolerate additional fluid removal at this time. FINDINGS: A total of approximately 500 mL of amber colored fluid was removed. Samples were sent to the laboratory as requested by the clinical team. IMPRESSION: Successful ultrasound guided therapeutic and diagnostic left-sided thoracentesis yielding 500 mL of pleural fluid. Read by: 10/12/2020, NP Electronically Signed   By: Anders Grant  M.D.   On: 10/10/2020 11:16    Medications:  Scheduled: . sodium chloride   Intravenous Once  . amLODipine  10 mg Oral Daily  . Chlorhexidine Gluconate Cloth  6 each Topical Q0600  . fenofibrate  54 mg Oral Daily  . pantoprazole (PROTONIX) IV  40 mg Intravenous Q12H  .  thiamine  100 mg Oral Daily   Continuous: . sodium chloride Stopped (10/01/20 1059)  . sodium chloride 50 mL/hr at 10/11/20 0634  . octreotide  (SANDOSTATIN)    IV infusion 50 mcg/hr (10/10/20 2327)  . ondansetron (ZOFRAN) IV Stopped (09/30/20 2014)  . 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 2) Anemia - s/p esophageal variceal banding. 3) Acute pancreatitis secondary to ETOH.   Clinically he is well.  He feels that he is tolerating PO, but his abdomen is tympanic.  His HGB is stable and there are no overt reports of bleeding.  Plan: 1) Continue with octreotide through the weekend. 2) Continue with clear liquid diet. 3) Monitor HGB and transfuse as necessary.  LOS: 12 days   Alden Feagan D 10/11/2020, 7:49 AM

## 2020-10-11 NOTE — Plan of Care (Signed)
  Problem: Clinical Measurements: Goal: Will remain free from infection Outcome: Progressing   Problem: Education: Goal: Knowledge of General Education information will improve Description: Including pain rating scale, medication(s)/side effects and non-pharmacologic comfort measures Outcome: Progressing   Problem: Health Behavior/Discharge Planning: Goal: Ability to manage health-related needs will improve Outcome: Progressing   Problem: Clinical Measurements: Goal: Ability to maintain clinical measurements within normal limits will improve Outcome: Progressing   Problem: Clinical Measurements: Goal: Will remain free from infection Outcome: Progressing   Problem: Clinical Measurements: Goal: Diagnostic test results will improve Outcome: Progressing   Problem: Clinical Measurements: Goal: Respiratory complications will improve Outcome: Progressing

## 2020-10-12 ENCOUNTER — Encounter (HOSPITAL_COMMUNITY): Payer: Self-pay | Admitting: Gastroenterology

## 2020-10-12 LAB — COMPREHENSIVE METABOLIC PANEL
ALT: 25 U/L (ref 0–44)
AST: 29 U/L (ref 15–41)
Albumin: 1.8 g/dL — ABNORMAL LOW (ref 3.5–5.0)
Alkaline Phosphatase: 78 U/L (ref 38–126)
Anion gap: 7 (ref 5–15)
BUN: 10 mg/dL (ref 6–20)
CO2: 27 mmol/L (ref 22–32)
Calcium: 7.7 mg/dL — ABNORMAL LOW (ref 8.9–10.3)
Chloride: 95 mmol/L — ABNORMAL LOW (ref 98–111)
Creatinine, Ser: 1.07 mg/dL (ref 0.61–1.24)
GFR, Estimated: >60 mL/min (ref >60)
Glucose, Bld: 119 mg/dL — ABNORMAL HIGH (ref 70–99)
Potassium: 4.3 mmol/L (ref 3.5–5.1)
Sodium: 129 mmol/L — ABNORMAL LOW (ref 135–145)
Total Bilirubin: 0.6 mg/dL (ref 0.3–1.2)
Total Protein: 5.3 g/dL — ABNORMAL LOW (ref 6.5–8.1)

## 2020-10-12 LAB — CBC WITH DIFFERENTIAL/PLATELET
Abs Immature Granulocytes: 0.55 10*3/uL — ABNORMAL HIGH (ref 0.00–0.07)
Basophils Absolute: 0 10*3/uL (ref 0.0–0.1)
Basophils Relative: 0 %
Eosinophils Absolute: 0.1 10*3/uL (ref 0.0–0.5)
Eosinophils Relative: 1 %
HCT: 20.7 % — ABNORMAL LOW (ref 39.0–52.0)
Hemoglobin: 6.9 g/dL — CL (ref 13.0–17.0)
Immature Granulocytes: 4 %
Lymphocytes Relative: 15 %
Lymphs Abs: 2.2 10*3/uL (ref 0.7–4.0)
MCH: 32.1 pg (ref 26.0–34.0)
MCHC: 33.3 g/dL (ref 30.0–36.0)
MCV: 96.3 fL (ref 80.0–100.0)
Monocytes Absolute: 1.1 10*3/uL — ABNORMAL HIGH (ref 0.1–1.0)
Monocytes Relative: 7 %
Neutro Abs: 10.9 10*3/uL — ABNORMAL HIGH (ref 1.7–7.7)
Neutrophils Relative %: 73 %
Platelets: 222 10*3/uL (ref 150–400)
RBC: 2.15 MIL/uL — ABNORMAL LOW (ref 4.22–5.81)
RDW: 14.5 % (ref 11.5–15.5)
WBC: 14.9 10*3/uL — ABNORMAL HIGH (ref 4.0–10.5)
nRBC: 0.3 % — ABNORMAL HIGH (ref 0.0–0.2)

## 2020-10-12 LAB — CULTURE, BLOOD (ROUTINE X 2)
Culture: NO GROWTH
Culture: NO GROWTH

## 2020-10-12 LAB — PREPARE RBC (CROSSMATCH)

## 2020-10-12 MED ORDER — SODIUM CHLORIDE 0.9% IV SOLUTION: Freq: Once | INTRAVENOUS | Status: AC

## 2020-10-12 MED ORDER — BOOST / RESOURCE BREEZE PO LIQD CUSTOM
1.0000 | Freq: Three times a day (TID) | ORAL | Status: DC
  Administered 2020-10-12 – 2020-10-13 (×5): 1 via ORAL

## 2020-10-12 NOTE — Progress Notes (Signed)
Received a call regarding Hg 6.9K in the setting of acute pancreatitis and presumptive GI bleed.  Order placed to transfuse 1U PRBC.  Repeat CBC as per blood trasfusion protocol.  We will continue to closely monitor.

## 2020-10-12 NOTE — Progress Notes (Signed)
Progress Note for [name]  Subjective: No complaints.  He was in the restroom having a bowel movement.  Objective: Vital signs in last 24 hours: Temp:  [98.4 F (36.9 C)-101.1 F (38.4 C)] 99.1 F (37.3 C) (05/15 0546) Pulse Rate:  [90-115] 90 (05/15 0701) Resp:  [16-20] 18 (05/15 0332) BP: (125-145)/(71-82) 135/82 (05/15 0701) SpO2:  [91 %-97 %] 91 % (05/15 0701) Last BM Date: 10/11/20  Intake/Output from previous day: 05/14 0701 - 05/15 0700 In: 1523.4 [I.V.:1193.4; Blood:330] Out: 1220 [Urine:1220] Intake/Output this shift: No intake/output data recorded.  The patient reported feeling better.  Less abdominal pain.  Lab Results: Recent Labs    10/09/20 1331 10/11/20 0358 10/12/20 0127  WBC  --  17.1* 14.9*  HGB 7.8* 7.2* 6.9*  HCT 23.0* 21.7* 20.7*  PLT  --  224 222   BMET Recent Labs    10/10/20 0432 10/11/20 0358 10/12/20 0127  NA 130* 132* 129*  K 3.9 4.2 4.3  CL 96* 96* 95*  CO2 25 28 27   GLUCOSE 154* 113* 119*  BUN 14 11 10   CREATININE 1.02 1.03 1.07  CALCIUM 7.9* 7.9* 7.7*   LFT Recent Labs    10/12/20 0127  PROT 5.3*  ALBUMIN 1.8*  AST 29  ALT 25  ALKPHOS 78  BILITOT 0.6   PT/INR No results for input(s): LABPROT, INR in the last 72 hours. Hepatitis Panel No results for input(s): HEPBSAG, HCVAB, HEPAIGM, HEPBIGM in the last 72 hours. C-Diff No results for input(s): CDIFFTOX in the last 72 hours. Fecal Lactopherrin No results for input(s): FECLLACTOFRN in the last 72 hours.  Studies/Results: DG Chest 1 View  Result Date: 10/10/2020 CLINICAL DATA:  Post thoracentesis EXAM: CHEST  1 VIEW COMPARISON:  Chest x-ray 10/06/2020.  Chest CT 10/07/2020. FINDINGS: Worsening moderate left pleural effusion and airspace disease in the left lower lobe. Very low lung volumes. No pneumothorax following thoracentesis. Heart size is accentuated by the low volumes. IMPRESSION: Worsening aeration/lung volumes with increasing left effusion and left lower  lobe airspace disease. No pneumothorax. Electronically Signed   By: 12/06/2020 M.D.   On: 10/10/2020 10:45   CT CHEST ABDOMEN PELVIS WO CONTRAST  Result Date: 10/11/2020 CLINICAL DATA:  Abdominal distension, persistent fever, pneumonia, complicated pancreatitis EXAM: CT CHEST, ABDOMEN AND PELVIS WITHOUT CONTRAST TECHNIQUE: Multidetector CT imaging of the chest, abdomen and pelvis was performed following the standard protocol without IV contrast. Oral enteric contrast was administered. COMPARISON:  10/07/2020 FINDINGS: CT CHEST FINDINGS Cardiovascular: No significant vascular findings. Normal heart size. No pericardial effusion. Mediastinum/Nodes: No enlarged mediastinal, hilar, or axillary lymph nodes. Thyroid gland, trachea, and esophagus demonstrate no significant findings. Lungs/Pleura: There is a moderate to large left pleural effusion and associated atelectasis or consolidation, which is increased compared to prior examination. There appears to be a soft tissue attenuation nodule in the posterior left pleural space measuring 5.2 x 2.9 cm (series 3, image 41). Scattered, new nonspecific ground-glass airspace opacities in the anterior right upper lobe (series 4, image 90). Musculoskeletal: No chest wall mass or suspicious bone lesions identified. CT ABDOMEN PELVIS FINDINGS Hepatobiliary: No solid liver abnormality is seen. Calcified sludge or excreted contrast in the gallbladder. No discrete gallstones, gallbladder wall thickening, or biliary dilatation. Pancreas: Redemonstrated diffuse fat stranding and inflammation about the pancreas. There is a large pseudocyst interposed between the greater curvature of the stomach and pancreatic body/tail, slightly increased in size compared to prior examination, measuring 13.2 x 8.9 cm, previously 12.0  x 8.0 cm when measured similarly (series 3, image 55). Spleen: Normal in size without significant abnormality. Adrenals/Urinary Tract: Adrenal glands are unremarkable.  Kidneys are normal, without renal calculi, solid lesion, or hydronephrosis. Bladder is unremarkable. Stomach/Bowel: The stomach is displaced and compressed by a large pseudocyst. Appendix appears normal. No evidence of bowel wall thickening, distention, or inflammatory changes. Vascular/Lymphatic: No significant vascular findings are present. No enlarged abdominal or pelvic lymph nodes. Reproductive: No mass or other abnormality. Other: No abdominal wall hernia or abnormality. Small volume ascites throughout the low abdomen and pelvis. Musculoskeletal: No acute or significant osseous findings. IMPRESSION: 1. Redemonstrated diffuse fat stranding and inflammation about the pancreas, consistent with acute pancreatitis. 2. There is a large pseudocyst interposed between the greater curvature of the stomach and pancreatic body/tail, slightly increased in size compared to prior examination, measuring 13.2 x 8.9 cm, previously 12.0 x 8.0 cm when measured similarly. The stomach is displaced and compressed by this large pseudocyst. The presence or absence of infection is not established by CT. 3. The pancreas is not well assessed for parenchymal necrosis by noncontrast CT. 4. There is a moderate to large left pleural effusion and associated atelectasis or consolidation, which is increased compared to prior examination. There appears to be a soft tissue attenuation nodule in the posterior left pleural space measuring 5.2 x 2.9 cm. Given acute development this is most likely clot. 5. Scattered, new nonspecific ground-glass airspace opacities in the anterior right upper lobe. 6. Small volume ascites throughout the low abdomen and pelvis. Electronically Signed   By: Lauralyn Primes M.D.   On: 10/11/2020 14:35   IR THORACENTESIS ASP PLEURAL SPACE W/IMG GUIDE  Result Date: 10/10/2020 INDICATION: History of pancreatitis found to have pneumonia with left-sided pleural effusion. Request is for therapeutic and diagnostic thoracentesis  EXAM: ULTRASOUND GUIDED THERAPEUTIC AND DIAGNOSTIC LEFT-SIDED THORACENTESIS MEDICATIONS: Lidocaine 1% 10 mL COMPLICATIONS: None immediate. PROCEDURE: An ultrasound guided thoracentesis was thoroughly discussed with the patient and questions answered. The benefits, risks, alternatives and complications were also discussed. The patient understands and wishes to proceed with the procedure. Written consent was obtained. Ultrasound was performed to localize and mark an adequate pocket of fluid in the left chest. The area was then prepped and draped in the normal sterile fashion. 1% Lidocaine was used for local anesthesia. Under ultrasound guidance a 6 Fr Safe-T-Centesis catheter was introduced. Thoracentesis was performed. The catheter was removed and a dressing applied. Patient unable to tolerate additional fluid removal at this time. FINDINGS: A total of approximately 500 mL of amber colored fluid was removed. Samples were sent to the laboratory as requested by the clinical team. IMPRESSION: Successful ultrasound guided therapeutic and diagnostic left-sided thoracentesis yielding 500 mL of pleural fluid. Read by: Anders Grant, NP Electronically Signed   By: Simonne Come M.D.   On: 10/10/2020 11:16    Medications:  Scheduled: . amLODipine  10 mg Oral Daily  . Chlorhexidine Gluconate Cloth  6 each Topical Q0600  . fenofibrate  54 mg Oral Daily  . pantoprazole (PROTONIX) IV  40 mg Intravenous Q12H  . thiamine  100 mg Oral Daily   Continuous: . sodium chloride Stopped (10/01/20 1059)  . sodium chloride 50 mL/hr at 10/12/20 0104  . octreotide  (SANDOSTATIN)    IV infusion 50 mcg/hr (10/11/20 2153)  . ondansetron (ZOFRAN) IV Stopped (09/30/20 2014)  . piperacillin-tazobactam (ZOSYN)  IV 3.375 g (10/12/20 0101)    Assessment/Plan: 1) ETOH pancreatitis with pseudocyst formation. 2) Anemia.  3) S/p banding of his esophageal varices.   The patient did not report any overt bleeding.  He was having a  bowel movement this AM and he reported feeling better.  His HGB was noted to be mildly declined at 6.9 g/dL from 7.2 g/dL.    Plan: 1) Agree with the blood transfusion. 2) Continue to monitor for any signs or symptoms of bleeding. 3) Advance diet.  LOS: 13 days   Mando Blatz D 10/12/2020, 8:31 AM

## 2020-10-12 NOTE — Progress Notes (Signed)
PROGRESS NOTE    Dennis [name]   NFA:[id number]  DOB: 05/30/1991  PCP: No primary care provider on file.    DOA: 09/29/2020 LOS: 5413   Brief Narrative   30 year old male with history of EtOH, tobacco and marijuana use, remote seizures came into the hospital with abdominal pain and diagnosed with acute pancreatitis.  2 weeks ago he was admitted to [facility] ED, diagnosed with alcoholic pancreatitis and discharged home.  He has not had any alcohol since.  His abdominal pain recurred and came back to the hospital, he was found to have triglycerides of 2400 and was sent to [facility], ICU to be evaluated for possible Plex for hypertriglyceridemia.  Symptomatically improved with insulin and dextrose infusion.  Remains in the hospital with persistent pain, development of pseudocyst. Patient also developed left lower lobe pneumonia. 5/11, continues to drop hemoglobin.  Black tarry stool. 5/12, underwent upper GI endoscopy with no active bleeding esophageal varices, large pseudocyst compressing on gastric fundus. Continues to remain febrile.    Assessment & Plan   Principal Problem:   Acute pancreatitis Active Problems:   Hypertriglyceridemia   AKI (acute kidney injury) (HCC)   Leukocytosis   Abnormal CT of the abdomen   Melena   Acute blood loss anemia   Esophageal varices without bleeding (HCC)  Acute blood loss anemia:  Hemoglobin 15 at base line  Dropped to 6.2, total 2 units transfusion and appropriate response, again dropped down to 7.2 -6.9 today.  Equilibrating.   1 more PRBC today for hemoglobin less than 7.   upper GI endoscopy 5/12 with esophageal varices banded with good results, no other active bleeding ulcers.  Symptomatic treatment. We will continue IV Protonix. Started on octreotide bolus and infusion, continue 72 hours of infusion.  Will complete today.  Acute pancreatitis due to EtOH and hypertriglyceridemia - initially admitted to ICU, treated with insulin  and dextrose infusion, now off.  TG's now 295 (less than 500 since 5/4). --Supportive care: IV fluids. --Soft diet.  Tolerating meal.  Advance to regular diet.  Nutritional supplements today. --CT chest/abdomen/pelvis with multiple pancreatic cyst, 10 cm pseudocyst with some compression on the stomach.  Seen by gastroenterology, suggested symptomatic treatment and 6 weeks follow-up. --Tolerating low-dose fenofibrate. -- Repeat CT scan of the chest abdomen pelvis was done that showed slightly increasing size of the pseudocyst, overall stable left sided pleural effusion and consolidation.  Left lower lobe pneumonia - CXR obtained 5/9 due to worsened leukocytosis and diminished lung bases on exam, showed LLL infiltrate and small pleural effusion.  CT scan with left lower lobe collapse/consolidation with some pleural effusion. --Currently on Zosyn that we will continue. --Mobility and aggressive chest physiotherapy. -- Thoracentesis 500 mm on 5/13 left pleural space, no evidence of infection. -- Due to persistent fever, repeat CT scan was done that shows fairly stable size of left-sided consolidation and pseudocyst.  Sepsis due to Pneumonia vs SIRS from pancreatitis. With leukocytosis, fever, tachycardia PNA identified on CXR 5/9 but was not excluded on xray of 5/3 with bibasilar atelectasis vs infiltrate. --Sepsis physiology improving, continues to have fever but less in intensity.  Hypertension - continue amlodipine.  Stable.  Polysubstance abuse - motivated and determined to quit.  Last drink of EtOH now more than a month.   Patient BMI: Body mass index is 29.7 kg/m.   DVT prophylaxis: Place and maintain sequential compression device Start: 10/10/20 1238 SCDs Start: 09/30/20 0121   Diet:  Diet Orders (From admission,  onward)    Start     Ordered   10/12/20 0835  Diet regular Room service appropriate? Yes; Fluid consistency: Thin  Diet effective now       Question Answer Comment   Room service appropriate? Yes   Fluid consistency: Thin      10/12/20 0834            Code Status: Full Code    Subjective 10/12/20   Patient seen and examined.  Breathing better.  Denies any abdominal pain.  He is able to eat soft diet.  2 or 3 bowel movements a day but mostly clear.  Denies any nausea or vomiting. Temperature maximum 101 over last 24 hours. Wants to go home as he is really missing his children.   Disposition Plan & Communication   Status is: Inpatient  Remains inpatient appropriate because:IV treatments appropriate due to intensity of illness or inability to take PO   Dispo: The patient is from: Home              Anticipated d/c is to: Home              Patient currently is not medically stable to d/c.   Difficult to place patient No    Consults, Procedures, Significant Events   Consultants:   Gastroenterology  Procedures:   None  Antimicrobials:  Anti-infectives (From admission, onward)   Start     Dose/Rate Route Frequency Ordered Stop   10/07/20 1645  piperacillin-tazobactam (ZOSYN) IVPB 3.375 g        3.375 g 12.5 mL/hr over 240 Minutes Intravenous Every 8 hours 10/07/20 1557     10/07/20 1000  azithromycin (ZITHROMAX) tablet 500 mg  Status:  Discontinued        500 mg Oral Daily 10/07/20 0749 10/07/20 1542   10/07/20 0845  cefTRIAXone (ROCEPHIN) 2 g in sodium chloride 0.9 % 100 mL IVPB  Status:  Discontinued        2 g 200 mL/hr over 30 Minutes Intravenous Every 24 hours 10/07/20 0749 10/07/20 1542   10/06/20 1315  amoxicillin-clavulanate (AUGMENTIN) 875-125 MG per tablet 1 tablet  Status:  Discontinued        1 tablet Oral Every 12 hours 10/06/20 1227 10/07/20 0749        Micro  No positive data yet.  Objective   Vitals:   10/12/20 0315 10/12/20 0332 10/12/20 0546 10/12/20 0701  BP:  (!) 143/71 (!) 141/75 135/82  Pulse: 95 92 95 90  Resp:  18  14  Temp:  98.6 F (37 C) 99.1 F (37.3 C) 99.8 F (37.7 C)  TempSrc:   Oral  Oral  SpO2: 96% 95% 95% 95%  Weight:      Height:        Intake/Output Summary (Last 24 hours) at 10/12/2020 1107 Last data filed at 10/12/2020 0549 Gross per 24 hour  Intake 1523.36 ml  Output 1220 ml  Net 303.36 ml   Filed Weights   10/09/20 0500 10/09/20 1321 10/10/20 0346  Weight: 102.4 kg 102.4 kg 102.1 kg    General: Looks comfortable on 1 to 2 L oxygen.  Currently on room air.  Not in any distress. Cardiovascular: S1-S2 normal.  No murmurs.  Tachycardic. Respiratory: Mostly bilateral clear.  Decreased air entry on the left side. Gastrointestinal: Soft.  Distended and mildly tender all over.  Bowel sounds present.  No rigidity or guarding. Ext: No edema.  No cyanosis. Neuro: Alert oriented x4.  Equal strength in all extremities.  Labs   Data Reviewed: I have personally reviewed following labs and imaging studies  CBC: Recent Labs  Lab 10/07/20 0258 10/08/20 0441 10/09/20 0136 10/09/20 0257 10/09/20 1331 10/11/20 0358 10/12/20 0127  WBC 20.8* 17.8* 15.5*  --   --  17.1* 14.9*  NEUTROABS  --  15.5* 13.0*  --   --  12.8* 10.9*  HGB 10.3* 8.4* 6.3* 6.2* 7.8* 7.2* 6.9*  HCT 31.6* 25.3* 19.2* 18.9* 23.0* 21.7* 20.7*  MCV 97.5 96.6 97.5  --   --  96.4 96.3  PLT 210 208 213  --   --  224 222   Basic Metabolic Panel: Recent Labs  Lab 10/07/20 0258 10/08/20 0441 10/09/20 0136 10/09/20 1331 10/10/20 0432 10/11/20 0358 10/12/20 0127  NA 129* 131* 130* 131* 130* 132* 129*  K 4.2 3.8 3.8 3.9 3.9 4.2 4.3  CL 95* 95* 96* 98 96* 96* 95*  CO2 22 25 25   --  25 28 27   GLUCOSE 105* 108* 138* 103* 154* 113* 119*  BUN 18 18 26* 18 14 11 10   CREATININE 0.96 0.94 0.97 0.80 1.02 1.03 1.07  CALCIUM 8.0* 8.0* 7.9*  --  7.9* 7.9* 7.7*  MG 1.9  --  2.0  --   --  2.0  --   PHOS  --   --  3.8  --   --   --   --    GFR: Estimated Creatinine Clearance: 127.9 mL/min (by C-G formula based on SCr of 1.07 mg/dL). Liver Function Tests: Recent Labs  Lab 10/06/20 0440 10/09/20 0136  10/10/20 0432 10/11/20 0358 10/12/20 0127  AST 27 36 37 32 29  ALT 30 35 34 29 25  ALKPHOS 119 112 102 86 78  BILITOT 1.2 0.4 0.7 0.8 0.6  PROT 5.6* 5.3* 5.7* 5.6* 5.3*  ALBUMIN 2.2* 1.9* 2.0* 1.9* 1.8*   Recent Labs  Lab 10/08/20 0441  LIPASE 69*   No results for input(s): AMMONIA in the last 168 hours. Coagulation Profile: No results for input(s): INR, PROTIME in the last 168 hours. Cardiac Enzymes: No results for input(s): CKTOTAL, CKMB, CKMBINDEX, TROPONINI in the last 168 hours. BNP (last 3 results) No results for input(s): PROBNP in the last 8760 hours. HbA1C: No results for input(s): HGBA1C in the last 72 hours. CBG: Recent Labs  Lab 10/05/20 1244 10/05/20 1959 10/06/20 0331  GLUCAP 124* 107* 114*   Lipid Profile: No results for input(s): CHOL, HDL, LDLCALC, TRIG, CHOLHDL, LDLDIRECT in the last 72 hours. Thyroid Function Tests: No results for input(s): TSH, T4TOTAL, FREET4, T3FREE, THYROIDAB in the last 72 hours. Anemia Panel: No results for input(s): VITAMINB12, FOLATE, FERRITIN, TIBC, IRON, RETICCTPCT in the last 72 hours. Sepsis Labs: Recent Labs  Lab 10/06/20 1448 10/07/20 0924 10/08/20 0441 10/09/20 0136  PROCALCITON 1.22 1.31 1.25 1.29  LATICACIDVEN 0.8  --   --   --     Recent Results (from the past 240 hour(s))  Culture, blood (routine x 2) Call MD if unable to obtain prior to antibiotics being given     Status: None   Collection Time: 10/07/20  9:24 AM   Specimen: BLOOD  Result Value Ref Range Status   Specimen Description BLOOD RIGHT ANTECUBITAL  Final   Special Requests   Final    BOTTLES DRAWN AEROBIC AND ANAEROBIC Blood Culture results may not be optimal due to an inadequate volume of blood received in culture bottles   Culture  Final    NO GROWTH 5 DAYS Performed at [facility], [address], [place], [place] [address]    Report Status 10/12/2020 FINAL  Final  Culture, blood (routine x 2) Call MD if unable to obtain prior  to antibiotics being given     Status: None   Collection Time: 10/07/20  9:24 AM   Specimen: BLOOD RIGHT FOREARM  Result Value Ref Range Status   Specimen Description BLOOD RIGHT FOREARM  Final   Special Requests   Final    BOTTLES DRAWN AEROBIC AND ANAEROBIC Blood Culture results may not be optimal due to an inadequate volume of blood received in culture bottles   Culture   Final    NO GROWTH 5 DAYS Performed at [facility], [address] [place], [place] [address]    Report Status 10/12/2020 FINAL  Final  Resp Panel by RT-PCR (Flu A&B, Covid) Nasopharyngeal Swab     Status: None   Collection Time: 10/09/20 10:36 AM   Specimen: Nasopharyngeal Swab; Nasopharyngeal(NP) swabs in vial transport medium  Result Value Ref Range Status   SARS Coronavirus 2 by RT PCR NEGATIVE NEGATIVE Final    Comment: (NOTE) SARS-CoV-2 target nucleic acids are NOT DETECTED.  The SARS-CoV-2 RNA is generally detectable in upper respiratory specimens during the acute phase of infection. The lowest concentration of SARS-CoV-2 viral copies this assay can detect is 138 copies/mL. A negative result does not preclude SARS-Cov-2 infection and should not be used as the sole basis for treatment or other patient management decisions. A negative result may occur with  improper specimen collection/handling, submission of specimen other than nasopharyngeal swab, presence of viral mutation(s) within the areas targeted by this assay, and inadequate number of viral copies(<138 copies/mL). A negative result must be combined with clinical observations, patient history, and epidemiological information. The expected result is Negative.  Fact Sheet for Patients:  BloggerCourse.com  Fact Sheet for Healthcare Providers:  SeriousBroker.it  This test is no t yet approved or cleared by the [place] FDA and  has been authorized for detection and/or diagnosis of  SARS-CoV-2 by FDA under an Emergency Use Authorization (EUA). This EUA will remain  in effect (meaning this test can be used) for the duration of the COVID-19 declaration under Section 564(b)(1) of the Act, 21 U.S.C.section 360bbb-3(b)(1), unless the authorization is terminated  or revoked sooner.       Influenza A by PCR NEGATIVE NEGATIVE Final   Influenza B by PCR NEGATIVE NEGATIVE Final    Comment: (NOTE) The Xpert Xpress SARS-CoV-2/FLU/RSV plus assay is intended as an aid in the diagnosis of influenza from Nasopharyngeal swab specimens and should not be used as a sole basis for treatment. Nasal washings and aspirates are unacceptable for Xpert Xpress SARS-CoV-2/FLU/RSV testing.  Fact Sheet for Patients: BloggerCourse.com  Fact Sheet for Healthcare Providers: SeriousBroker.it  This test is not yet approved or cleared by the [place] FDA and has been authorized for detection and/or diagnosis of SARS-CoV-2 by FDA under an Emergency Use Authorization (EUA). This EUA will remain in effect (meaning this test can be used) for the duration of the COVID-19 declaration under Section 564(b)(1) of the Act, 21 U.S.C. section 360bbb-3(b)(1), unless the authorization is terminated or revoked.  Performed at [facility], [address], [place], [place] [address]   Body fluid culture w Gram Stain     Status: None (Preliminary result)   Collection Time: 10/10/20 10:37 AM  Specimen: Lung, Left; Pleural Fluid  Result Value Ref Range Status   Specimen Description PLEURAL FLUID  Final   Special Requests LUNG LEFT  Final   Gram Stain   Final    FEW WBC PRESENT,BOTH PMN AND MONONUCLEAR NO ORGANISMS SEEN    Culture   Final    NO GROWTH 2 DAYS Performed at [facility], [address], [place], [place] [address]    Report Status PENDING  Incomplete      Imaging Studies   CT CHEST ABDOMEN PELVIS WO CONTRAST  Result  Date: 10/11/2020 CLINICAL DATA:  Abdominal distension, persistent fever, pneumonia, complicated pancreatitis EXAM: CT CHEST, ABDOMEN AND PELVIS WITHOUT CONTRAST TECHNIQUE: Multidetector CT imaging of the chest, abdomen and pelvis was performed following the standard protocol without IV contrast. Oral enteric contrast was administered. COMPARISON:  10/07/2020 FINDINGS: CT CHEST FINDINGS Cardiovascular: No significant vascular findings. Normal heart size. No pericardial effusion. Mediastinum/Nodes: No enlarged mediastinal, hilar, or axillary lymph nodes. Thyroid gland, trachea, and esophagus demonstrate no significant findings. Lungs/Pleura: There is a moderate to large left pleural effusion and associated atelectasis or consolidation, which is increased compared to prior examination. There appears to be a soft tissue attenuation nodule in the posterior left pleural space measuring 5.2 x 2.9 cm (series 3, image 41). Scattered, new nonspecific ground-glass airspace opacities in the anterior right upper lobe (series 4, image 90). Musculoskeletal: No chest wall mass or suspicious bone lesions identified. CT ABDOMEN PELVIS FINDINGS Hepatobiliary: No solid liver abnormality is seen. Calcified sludge or excreted contrast in the gallbladder. No discrete gallstones, gallbladder wall thickening, or biliary dilatation. Pancreas: Redemonstrated diffuse fat stranding and inflammation about the pancreas. There is a large pseudocyst interposed between the greater curvature of the stomach and pancreatic body/tail, slightly increased in size compared to prior examination, measuring 13.2 x 8.9 cm, previously 12.0 x 8.0 cm when measured similarly (series 3, image 55). Spleen: Normal in size without significant abnormality. Adrenals/Urinary Tract: Adrenal glands are unremarkable. Kidneys are normal, without renal calculi, solid lesion, or hydronephrosis. Bladder is unremarkable. Stomach/Bowel: The stomach is displaced and compressed by  a large pseudocyst. Appendix appears normal. No evidence of bowel wall thickening, distention, or inflammatory changes. Vascular/Lymphatic: No significant vascular findings are present. No enlarged abdominal or pelvic lymph nodes. Reproductive: No mass or other abnormality. Other: No abdominal wall hernia or abnormality. Small volume ascites throughout the low abdomen and pelvis. Musculoskeletal: No acute or significant osseous findings. IMPRESSION: 1. Redemonstrated diffuse fat stranding and inflammation about the pancreas, consistent with acute pancreatitis. 2. There is a large pseudocyst interposed between the greater curvature of the stomach and pancreatic body/tail, slightly increased in size compared to prior examination, measuring 13.2 x 8.9 cm, previously 12.0 x 8.0 cm when measured similarly. The stomach is displaced and compressed by this large pseudocyst. The presence or absence of infection is not established by CT. 3. The pancreas is not well assessed for parenchymal necrosis by noncontrast CT. 4. There is a moderate to large left pleural effusion and associated atelectasis or consolidation, which is increased compared to prior examination. There appears to be a soft tissue attenuation nodule in the posterior left pleural space measuring 5.2 x 2.9 cm. Given acute development this is most likely clot. 5. Scattered, new nonspecific ground-glass airspace opacities in the anterior right upper lobe. 6. Small volume ascites throughout the low abdomen and pelvis. Electronically Signed   By: Lauralyn Primes M.D.   On: 10/11/2020 14:35  Medications   Scheduled Meds: . amLODipine  10 mg Oral Daily  . Chlorhexidine Gluconate Cloth  6 each Topical Q0600  . feeding supplement  1 Container Oral TID BM  . fenofibrate  54 mg Oral Daily  . pantoprazole (PROTONIX) IV  40 mg Intravenous Q12H  . thiamine  100 mg Oral Daily   Continuous Infusions: . sodium chloride Stopped (10/01/20 1059)  . sodium chloride  50 mL/hr at 10/12/20 0104  . octreotide  (SANDOSTATIN)    IV infusion 50 mcg/hr (10/12/20 0848)  . ondansetron (ZOFRAN) IV Stopped (09/30/20 2014)  . piperacillin-tazobactam (ZOSYN)  IV 3.375 g (10/12/20 0846)       LOS: 13 days    Time spent: 30 minutes    Dorcas Carrow, MD [facility]  10/12/2020, 11:07 AM

## 2020-10-13 LAB — BPAM RBC
Blood Product Expiration Date: 202205262359
Blood Product Expiration Date: 202206032359
Blood Product Expiration Date: 202206052359
ISSUE DATE / TIME: 202205120609
ISSUE DATE / TIME: 202205120936
ISSUE DATE / TIME: 202205150302
Unit Type and Rh: 5100
Unit Type and Rh: 5100
Unit Type and Rh: 5100

## 2020-10-13 LAB — CBC WITH DIFFERENTIAL/PLATELET
Abs Immature Granulocytes: 0.48 10*3/uL — ABNORMAL HIGH (ref 0.00–0.07)
Basophils Absolute: 0 10*3/uL (ref 0.0–0.1)
Basophils Relative: 0 %
Eosinophils Absolute: 0.1 10*3/uL (ref 0.0–0.5)
Eosinophils Relative: 1 %
HCT: 24.7 % — ABNORMAL LOW (ref 39.0–52.0)
Hemoglobin: 8.1 g/dL — ABNORMAL LOW (ref 13.0–17.0)
Immature Granulocytes: 4 %
Lymphocytes Relative: 16 %
Lymphs Abs: 1.9 10*3/uL (ref 0.7–4.0)
MCH: 30.9 pg (ref 26.0–34.0)
MCHC: 32.8 g/dL (ref 30.0–36.0)
MCV: 94.3 fL (ref 80.0–100.0)
Monocytes Absolute: 1 10*3/uL (ref 0.1–1.0)
Monocytes Relative: 9 %
Neutro Abs: 8.6 10*3/uL — ABNORMAL HIGH (ref 1.7–7.7)
Neutrophils Relative %: 70 %
Platelets: 227 10*3/uL (ref 150–400)
RBC: 2.62 MIL/uL — ABNORMAL LOW (ref 4.22–5.81)
RDW: 14.6 % (ref 11.5–15.5)
WBC: 12.1 10*3/uL — ABNORMAL HIGH (ref 4.0–10.5)
nRBC: 0.2 % (ref 0.0–0.2)

## 2020-10-13 LAB — TYPE AND SCREEN
ABO/RH(D): O POS
Antibody Screen: NEGATIVE
Unit division: 0
Unit division: 0
Unit division: 0

## 2020-10-13 LAB — BODY FLUID CULTURE W GRAM STAIN: Culture: NO GROWTH

## 2020-10-13 LAB — CYTOLOGY - NON PAP

## 2020-10-13 LAB — SURGICAL PATHOLOGY

## 2020-10-13 MED ORDER — PANTOPRAZOLE SODIUM 40 MG PO TBEC
40.0000 mg | DELAYED_RELEASE_TABLET | Freq: Two times a day (BID) | ORAL | Status: DC
  Administered 2020-10-13 (×2): 40 mg via ORAL
  Filled 2020-10-13 (×2): qty 1

## 2020-10-13 NOTE — Progress Notes (Addendum)
Daily Rounding Note  10/13/2020, 11:14 AM  LOS: 14 days   SUBJECTIVE:   Chief complaint:   Acute pancreatitis with evolving pseudocysts.  Nonbleeding esophageal varices.  Splenic vein thrombosis.  Tolerating regular diet.  Eating 50 to 100% of regular trays.  No nausea or vomiting.  Abdominal pain resolved but still having pain in the middle of his back.  Feels better.  Breathing continues improved.  Walking in the hallway. Stools loose but beginning to get some form today.  OBJECTIVE:         Vital signs in last 24 hours:    Temp:  [99 F (37.2 C)-102.6 F (39.2 C)] 99 F (37.2 C) (05/16 0846) Pulse Rate:  [94-109] 94 (05/16 0846) Resp:  [16-20] 20 (05/16 0846) BP: (125-146)/(75-81) 125/78 (05/16 0846) SpO2:  [93 %-99 %] 99 % (05/16 0846) Last BM Date: 10/13/20 Filed Weights   10/09/20 0500 10/09/20 1321 10/10/20 0346  Weight: 102.4 kg 102.4 kg 102.1 kg   General: Patient looks well.  NAD. Heart: RRR Chest: Diminished breath sound Abdomen: Soft, nontender, nondistended.  Active bowel sounds. Extremities: No CCE. Neuro/Psych: Oriented x3.  Fully alert.  No tremors or weakness.  Intake/Output from previous day: 05/15 0701 - 05/16 0700 In: 2820.7 [P.O.:600; I.V.:2020.6; IV Piggyback:200.1] Out: 3720 [Urine:3720]  Intake/Output this shift: No intake/output data recorded.  Lab Results: Recent Labs    10/11/20 0358 10/12/20 0127 10/13/20 0204  WBC 17.1* 14.9* 12.1*  HGB 7.2* 6.9* 8.1*  HCT 21.7* 20.7* 24.7*  PLT 224 222 227   BMET Recent Labs    10/11/20 0358 10/12/20 0127  NA 132* 129*  K 4.2 4.3  CL 96* 95*  CO2 28 27  GLUCOSE 113* 119*  BUN 11 10  CREATININE 1.03 1.07  CALCIUM 7.9* 7.7*   LFT Recent Labs    10/11/20 0358 10/12/20 0127  PROT 5.6* 5.3*  ALBUMIN 1.9* 1.8*  AST 32 29  ALT 29 25  ALKPHOS 86 78  BILITOT 0.8 0.6   PT/INR No results for input(s): LABPROT, INR in the last  72 hours. Hepatitis Panel No results for input(s): HEPBSAG, HCVAB, HEPAIGM, HEPBIGM in the last 72 hours.  Studies/Results: CT CHEST ABDOMEN PELVIS WO CONTRAST  Result Date: 10/11/2020 CLINICAL DATA:  Abdominal distension, persistent fever, pneumonia, complicated pancreatitis EXAM: CT CHEST, ABDOMEN AND PELVIS WITHOUT CONTRAST TECHNIQUE: Multidetector CT imaging of the chest, abdomen and pelvis was performed following the standard protocol without IV contrast. Oral enteric contrast was administered. COMPARISON:  10/07/2020 FINDINGS: CT CHEST FINDINGS Cardiovascular: No significant vascular findings. Normal heart size. No pericardial effusion. Mediastinum/Nodes: No enlarged mediastinal, hilar, or axillary lymph nodes. Thyroid gland, trachea, and esophagus demonstrate no significant findings. Lungs/Pleura: There is a moderate to large left pleural effusion and associated atelectasis or consolidation, which is increased compared to prior examination. There appears to be a soft tissue attenuation nodule in the posterior left pleural space measuring 5.2 x 2.9 cm (series 3, image 41). Scattered, new nonspecific ground-glass airspace opacities in the anterior right upper lobe (series 4, image 90). Musculoskeletal: No chest wall mass or suspicious bone lesions identified. CT ABDOMEN PELVIS FINDINGS Hepatobiliary: No solid liver abnormality is seen. Calcified sludge or excreted contrast in the gallbladder. No discrete gallstones, gallbladder wall thickening, or biliary dilatation. Pancreas: Redemonstrated diffuse fat stranding and inflammation about the pancreas. There is a large pseudocyst interposed between the greater curvature of the stomach and pancreatic body/tail, slightly  increased in size compared to prior examination, measuring 13.2 x 8.9 cm, previously 12.0 x 8.0 cm when measured similarly (series 3, image 55). Spleen: Normal in size without significant abnormality. Adrenals/Urinary Tract: Adrenal glands  are unremarkable. Kidneys are normal, without renal calculi, solid lesion, or hydronephrosis. Bladder is unremarkable. Stomach/Bowel: The stomach is displaced and compressed by a large pseudocyst. Appendix appears normal. No evidence of bowel wall thickening, distention, or inflammatory changes. Vascular/Lymphatic: No significant vascular findings are present. No enlarged abdominal or pelvic lymph nodes. Reproductive: No mass or other abnormality. Other: No abdominal wall hernia or abnormality. Small volume ascites throughout the low abdomen and pelvis. Musculoskeletal: No acute or significant osseous findings. IMPRESSION: 1. Redemonstrated diffuse fat stranding and inflammation about the pancreas, consistent with acute pancreatitis. 2. There is a large pseudocyst interposed between the greater curvature of the stomach and pancreatic body/tail, slightly increased in size compared to prior examination, measuring 13.2 x 8.9 cm, previously 12.0 x 8.0 cm when measured similarly. The stomach is displaced and compressed by this large pseudocyst. The presence or absence of infection is not established by CT. 3. The pancreas is not well assessed for parenchymal necrosis by noncontrast CT. 4. There is a moderate to large left pleural effusion and associated atelectasis or consolidation, which is increased compared to prior examination. There appears to be a soft tissue attenuation nodule in the posterior left pleural space measuring 5.2 x 2.9 cm. Given acute development this is most likely clot. 5. Scattered, new nonspecific ground-glass airspace opacities in the anterior right upper lobe. 6. Small volume ascites throughout the low abdomen and pelvis. Electronically Signed   By: Lauralyn Primes M.D.   On: 10/11/2020 14:35   Scheduled Meds: . amLODipine  10 mg Oral Daily  . Chlorhexidine Gluconate Cloth  6 each Topical Q0600  . feeding supplement  1 Container Oral TID BM  . fenofibrate  54 mg Oral Daily  . pantoprazole   40 mg Oral BID  . thiamine  100 mg Oral Daily   Continuous Infusions: . sodium chloride Stopped (10/01/20 1059)  . ondansetron [facility]) IV Stopped (09/30/20 2014)   PRN Meds:.sodium chloride, acetaminophen, docusate sodium, hydrALAZINE, HYDROmorphone (DILAUDID) injection, labetalol, ondansetron (ZOFRAN) IV, oxyCODONE  ASSESMENT:   *   Acute, recurrent vs unresolved pancreatitis.  Initially presented in April 2022.  initially attributed to ETOH but at this presentation, TTG 2423  Abstinent of alcohol since May Large, perigastric fluid collection and smaller collection along the gastric antrum, both not well organized but likely evolving pseudocyst. Empiric Zosyn discontinued as of this morning.  *   SVT, splenic vein thrombosis w collaterals.    *   Elevated blood triglycerides.  2423 >> 255.  Fenofibrate in place.  *     hyponatremia, 129.  *    hypoalbuminemia.  Albumin 1.8.  *    normocytic anemia.  Hb 6.9 >> PRBC >> 8.1. Low iron 38, low TIBC 209.  Iron sats, folate, B12 WNL.  Ferritin elevated 1806.     *   Melena, FOBT positive.  Initial 10/07/2020 CT showed thickening at distal transverse, splenic flexure and proximal descending colon. 10/09/2020 EGD: Grade 2 nonbleeding esophageal varices eradication with bands.  Extrinsic compression in the gastric fundus, body and antrum from extrinsic fluid collections.  Duodenal erosions.  Surgical pathology still pending.  *   Left pleural effusion, 10/10/2020 thoracentesis.    PLAN   *    Treated patient will likely be ready  for discharge tomorrow. Total of 4 weeks Protonix 40 p.o. twice daily.  After that drop to once daily.  *    GI signing off.  GI follow-up was arranged for 6/15 with NP Wilmon Pali, 7/5 with Dr. Russella Dar.   *   Recommend boost, Ensure or Carnation complete as nutritional supplements at discharge, 2-3 times daily.     Jennye Moccasin  10/13/2020, 11:14 AM Phone [phone]

## 2020-10-13 NOTE — Progress Notes (Signed)
PROGRESS NOTE    Dennis [name]   EXB:[id number]  DOB: 01/04/1991  PCP: No primary care provider on file.    DOA: 09/29/2020 LOS: 55   Brief Narrative   30 year old male with history of EtOH, tobacco and marijuana use, remote seizures came into the hospital with abdominal pain and diagnosed with acute pancreatitis.  2 weeks ago he was admitted to [facility] ED, diagnosed with alcoholic pancreatitis and discharged home.  He has not had any alcohol since.  His abdominal pain recurred and came back to the hospital, he was found to have triglycerides of 2400 and was sent to [facility], ICU to be evaluated for possible Plex for hypertriglyceridemia.  Symptomatically improved with insulin and dextrose infusion.  Remains in the hospital with persistent pain, development of pseudocyst. Patient also developed left lower lobe pneumonia. 5/11, continues to drop hemoglobin.  Black tarry stool. 5/12, underwent upper GI endoscopy with no active bleeding esophageal varices, large pseudocyst compressing on gastric fundus. Clinically improving, continues to have intermittent fever.    Assessment & Plan   Principal Problem:   Acute pancreatitis Active Problems:   Hypertriglyceridemia   AKI (acute kidney injury) (HCC)   Leukocytosis   Abnormal CT of the abdomen   Melena   Acute blood loss anemia   Esophageal varices without bleeding (HCC)  Acute blood loss anemia:  Hemoglobin 15 at base line  Dropped to 6.2, received total 3 units of PRBC.  Now stabilizing.  Hemoglobin 8.2. EGD with esophageal varices, no active bleeding, banded. Received octreotide infusion, more than 4 days, discontinued today. On IV Protonix, changed to oral Protonix today.  Acute pancreatitis due to EtOH and hypertriglyceridemia - initially admitted to ICU, treated with insulin and dextrose infusion, now off.  TG's now 295 (less than 500 since 5/4). --Slow recovery. --Soft diet.  Tolerating meal.  Advance to regular  diet.  Nutritional supplements today. --CT chest/abdomen/pelvis with multiple pancreatic cyst, 10 cm pseudocyst with some compression on the stomach.  Seen by gastroenterology, suggested symptomatic treatment and 6 weeks follow-up. --Tolerating low-dose fenofibrate. -- Repeat CT scan of the chest abdomen pelvis was done that showed slightly increasing size of the pseudocyst, overall stable left sided pleural effusion and consolidation.  Tolerating regular diet.  Start mobilizing.  GI plan to follow-up outpatient with repeat CT scan in 1 month.  Left lower lobe pneumonia - CXR obtained 5/9 due to worsened leukocytosis and diminished lung bases on exam, showed LLL infiltrate and small pleural effusion.  CT scan with left lower lobe collapse/consolidation with some pleural effusion. -- Currently on Zosyn.  Completed 7 days of antibiotic therapy, continues to have intermittent fever which is probably metabolic fever.  Will discontinue antibiotics and monitor. --Mobility and aggressive chest physiotherapy. -- Thoracentesis 500 mm on 5/13 left pleural space, no evidence of infection. -- Due to persistent fever, repeat CT scan was done that shows fairly stable size of left-sided consolidation and pseudocyst.  Sepsis due to Pneumonia vs SIRS from pancreatitis. With leukocytosis, fever, tachycardia PNA identified on CXR 5/9 but was not excluded on xray of 5/3 with bibasilar atelectasis vs infiltrate. --Sepsis physiology improving, continues to have fever but less in intensity.  Discontinue antibiotics and monitor.  Hypertension - continue amlodipine.  Stable.  Polysubstance abuse - motivated and determined to quit.  Last drink of EtOH now more than a month.  Discontinue cardiac telemetry.  Discontinue IV fluids.  Discontinue antibiotics and monitor. Mobilize in the hallway. Changed to oral  medications in preparation for discharge.   Patient BMI: Body mass index is 29.7 kg/m.   DVT prophylaxis:  Place and maintain sequential compression device Start: 10/10/20 1238 SCDs Start: 09/30/20 0121   Diet:  Diet Orders (From admission, onward)    Start     Ordered   10/12/20 0835  Diet regular Room service appropriate? Yes; Fluid consistency: Thin  Diet effective now       Question Answer Comment  Room service appropriate? Yes   Fluid consistency: Thin      10/12/20 0834            Code Status: Full Code    Subjective 10/13/20   Patient seen and examined.  He himself has no complaints.  Some abdominal distention is present however he is able to eat food.  He did have some difficulty after eating pizza yesterday.  He started having some epigastric discomfort however did not need to use any pain medications.   Disposition Plan & Communication   Status is: Inpatient  Remains inpatient appropriate because:IV treatments appropriate due to intensity of illness or inability to take PO   Dispo: The patient is from: Home              Anticipated d/c is to: Home, anticipate tomorrow 5/17.              Patient currently is not medically stable to d/c.   Difficult to place patient No    Consults, Procedures, Significant Events   Consultants:   Gastroenterology  Procedures:   None  Antimicrobials:  Anti-infectives (From admission, onward)   Start     Dose/Rate Route Frequency Ordered Stop   10/07/20 1645  piperacillin-tazobactam (ZOSYN) IVPB 3.375 g  Status:  Discontinued        3.375 g 12.5 mL/hr over 240 Minutes Intravenous Every 8 hours 10/07/20 1557 10/13/20 0831   10/07/20 1000  azithromycin (ZITHROMAX) tablet 500 mg  Status:  Discontinued        500 mg Oral Daily 10/07/20 0749 10/07/20 1542   10/07/20 0845  cefTRIAXone (ROCEPHIN) 2 g in sodium chloride 0.9 % 100 mL IVPB  Status:  Discontinued        2 g 200 mL/hr over 30 Minutes Intravenous Every 24 hours 10/07/20 0749 10/07/20 1542   10/06/20 1315  amoxicillin-clavulanate (AUGMENTIN) 875-125 MG per tablet 1 tablet   Status:  Discontinued        1 tablet Oral Every 12 hours 10/06/20 1227 10/07/20 0749        Micro  No positive data yet.  Objective   Vitals:   10/12/20 1946 10/12/20 2328 10/13/20 0357 10/13/20 0846  BP: (!) 146/80 133/81 135/75 125/78  Pulse: (!) 109 98 (!) 108 94  Resp:    20  Temp: (!) 101.3 F (38.5 C) 99.9 F (37.7 C) 99.6 F (37.6 C) 99 F (37.2 C)  TempSrc: Oral Oral Oral Oral  SpO2: 97% 96% 94% 99%  Weight:      Height:        Intake/Output Summary (Last 24 hours) at 10/13/2020 0934 Last data filed at 10/13/2020 0700 Gross per 24 hour  Intake 2096.7 ml  Output 3720 ml  Net -1623.3 ml   Filed Weights   10/09/20 0500 10/09/20 1321 10/10/20 0346  Weight: 102.4 kg 102.4 kg 102.1 kg   General: Looks fairly comfortable.  Sitting at the edge of the bed and eating breakfast.  On room air. Cardiovascular: S1-S2 normal.  No added sounds. Respiratory: Mostly clear.  Has decreased air entry on the left base. Gastrointestinal: Soft.  Distended and mildly tender.  Bowel sounds present. Ext: No edema or cyanosis. Neuro: Alert oriented x4.  No neurological deficits.   Labs   Data Reviewed: I have personally reviewed following labs and imaging studies  CBC: Recent Labs  Lab 10/08/20 0441 10/09/20 0136 10/09/20 0257 10/09/20 1331 10/11/20 0358 10/12/20 0127 10/13/20 0204  WBC 17.8* 15.5*  --   --  17.1* 14.9* 12.1*  NEUTROABS 15.5* 13.0*  --   --  12.8* 10.9* 8.6*  HGB 8.4* 6.3* 6.2* 7.8* 7.2* 6.9* 8.1*  HCT 25.3* 19.2* 18.9* 23.0* 21.7* 20.7* 24.7*  MCV 96.6 97.5  --   --  96.4 96.3 94.3  PLT 208 213  --   --  224 222 227   Basic Metabolic Panel: Recent Labs  Lab 10/07/20 0258 10/08/20 0441 10/09/20 0136 10/09/20 1331 10/10/20 0432 10/11/20 0358 10/12/20 0127  NA 129* 131* 130* 131* 130* 132* 129*  K 4.2 3.8 3.8 3.9 3.9 4.2 4.3  CL 95* 95* 96* 98 96* 96* 95*  CO2 --  GLUCOSE 105* 108* 138* 103* 154* 113* 119*  BUN 18 18 26*  CREATININE 0.96 0.94 0.97 0.80 1.02 1.03 1.07  CALCIUM 8.0* 8.0* 7.9*  --  7.9* 7.9* 7.7*  MG 1.9  --  2.0  --   --  2.0  --   PHOS  --   --  3.8  --   --   --   --    GFR: Estimated Creatinine Clearance: 127.9 mL/min (by C-G formula based on SCr of 1.07 mg/dL). Liver Function Tests: Recent Labs  Lab 10/09/20 0136 10/10/20 0432 10/11/20 0358 10/12/20 0127  AST 36 37 32 29  ALT 35 34 29 25  ALKPHOS 112 102 86 78  BILITOT 0.4 0.7 0.8 0.6  PROT 5.3* 5.7* 5.6* 5.3*  ALBUMIN 1.9* 2.0* 1.9* 1.8*   Recent Labs  Lab 10/08/20 0441  LIPASE 69*   No results for input(s): AMMONIA in the last 168 hours. Coagulation Profile: No results for input(s): INR, PROTIME in the last 168 hours. Cardiac Enzymes: No results for input(s): CKTOTAL, CKMB, CKMBINDEX, TROPONINI in the last 168 hours. BNP (last 3 results) No results for input(s): PROBNP in the last 8760 hours. HbA1C: No results for input(s): HGBA1C in the last 72 hours. CBG: No results for input(s): GLUCAP in the last 168 hours. Lipid Profile: No results for input(s): CHOL, HDL, LDLCALC, TRIG, CHOLHDL, LDLDIRECT in the last 72 hours. Thyroid Function Tests: No results for input(s): TSH, T4TOTAL, FREET4, T3FREE, THYROIDAB in the last 72 hours. Anemia Panel: No results for input(s): VITAMINB12, FOLATE, FERRITIN, TIBC, IRON, RETICCTPCT in the last 72 hours. Sepsis Labs: Recent Labs  Lab 10/06/20 1448 10/07/20 0924 10/08/20 0441 10/09/20 0136  PROCALCITON 1.22 1.31 1.25 1.29  LATICACIDVEN 0.8  --   --   --     Recent Results (from the past 240 hour(s))  Culture, blood (routine x 2) Call MD if unable to obtain prior to antibiotics being given     Status: None   Collection Time: 10/07/20  9:24 AM   Specimen: BLOOD  Result Value Ref Range Status   Specimen Description BLOOD RIGHT ANTECUBITAL  Final   Special Requests   Final    BOTTLES DRAWN AEROBIC AND ANAEROBIC Blood Culture results may  not be optimal due to an  inadequate volume of blood received in culture bottles   Culture   Final    NO GROWTH 5 DAYS Performed at [facility], [address] [place], [place] [address]    Report Status 10/12/2020 FINAL  Final  Culture, blood (routine x 2) Call MD if unable to obtain prior to antibiotics being given     Status: None   Collection Time: 10/07/20  9:24 AM   Specimen: BLOOD RIGHT FOREARM  Result Value Ref Range Status   Specimen Description BLOOD RIGHT FOREARM  Final   Special Requests   Final    BOTTLES DRAWN AEROBIC AND ANAEROBIC Blood Culture results may not be optimal due to an inadequate volume of blood received in culture bottles   Culture   Final    NO GROWTH 5 DAYS Performed at [facility], [address] [place], [place] [address]    Report Status 10/12/2020 FINAL  Final  Resp Panel by RT-PCR (Flu A&B, Covid) Nasopharyngeal Swab     Status: None   Collection Time: 10/09/20 10:36 AM   Specimen: Nasopharyngeal Swab; Nasopharyngeal(NP) swabs in vial transport medium  Result Value Ref Range Status   SARS Coronavirus 2 by RT PCR NEGATIVE NEGATIVE Final    Comment: (NOTE) SARS-CoV-2 target nucleic acids are NOT DETECTED.  The SARS-CoV-2 RNA is generally detectable in upper respiratory specimens during the acute phase of infection. The lowest concentration of SARS-CoV-2 viral copies this assay can detect is 138 copies/mL. A negative result does not preclude SARS-Cov-2 infection and should not be used as the sole basis for treatment or other patient management decisions. A negative result may occur with  improper specimen collection/handling, submission of specimen other than nasopharyngeal swab, presence of viral mutation(s) within the areas targeted by this assay, and inadequate number of viral copies(<138 copies/mL). A negative result must be combined with clinical observations, patient history, and epidemiological information. The expected result is Negative.  Fact  Sheet for Patients:  BloggerCourse.comhttps://www.fda.gov/media/152166/download  Fact Sheet for Healthcare Providers:  SeriousBroker.ithttps://www.fda.gov/media/152162/download  This test is no t yet approved or cleared by the [place] FDA and  has been authorized for detection and/or diagnosis of SARS-CoV-2 by FDA under an Emergency Use Authorization (EUA). This EUA will remain  in effect (meaning this test can be used) for the duration of the COVID-19 declaration under Section 564(b)(1) of the Act, 21 U.S.C.section 360bbb-3(b)(1), unless the authorization is terminated  or revoked sooner.       Influenza A by PCR NEGATIVE NEGATIVE Final   Influenza B by PCR NEGATIVE NEGATIVE Final    Comment: (NOTE) The Xpert Xpress SARS-CoV-2/FLU/RSV plus assay is intended as an aid in the diagnosis of influenza from Nasopharyngeal swab specimens and should not be used as a sole basis for treatment. Nasal washings and aspirates are unacceptable for Xpert Xpress SARS-CoV-2/FLU/RSV testing.  Fact Sheet for Patients: BloggerCourse.comhttps://www.fda.gov/media/152166/download  Fact Sheet for Healthcare Providers: SeriousBroker.ithttps://www.fda.gov/media/152162/download  This test is not yet approved or cleared by the [place] FDA and has been authorized for detection and/or diagnosis of SARS-CoV-2 by FDA under an Emergency Use Authorization (EUA). This EUA will remain in effect (meaning this test can be used) for the duration of the COVID-19 declaration under Section 564(b)(1) of the Act, 21 U.S.C. section 360bbb-3(b)(1), unless the authorization is terminated or revoked.  Performed at [facility], [address] [place], [place] [address]   Body fluid culture w  Gram Stain     Status: None (Preliminary result)   Collection Time: 10/10/20 10:37 AM   Specimen: Lung, Left; Pleural Fluid  Result Value Ref Range Status   Specimen Description PLEURAL FLUID  Final   Special Requests LUNG LEFT  Final   Gram Stain   Final    FEW WBC  PRESENT,BOTH PMN AND MONONUCLEAR NO ORGANISMS SEEN    Culture   Final    NO GROWTH 2 DAYS Performed at [facility], [address] [place], [place] [address]    Report Status PENDING  Incomplete      Imaging Studies   CT CHEST ABDOMEN PELVIS WO CONTRAST  Result Date: 10/11/2020 CLINICAL DATA:  Abdominal distension, persistent fever, pneumonia, complicated pancreatitis EXAM: CT CHEST, ABDOMEN AND PELVIS WITHOUT CONTRAST TECHNIQUE: Multidetector CT imaging of the chest, abdomen and pelvis was performed following the standard protocol without IV contrast. Oral enteric contrast was administered. COMPARISON:  10/07/2020 FINDINGS: CT CHEST FINDINGS Cardiovascular: No significant vascular findings. Normal heart size. No pericardial effusion. Mediastinum/Nodes: No enlarged mediastinal, hilar, or axillary lymph nodes. Thyroid gland, trachea, and esophagus demonstrate no significant findings. Lungs/Pleura: There is a moderate to large left pleural effusion and associated atelectasis or consolidation, which is increased compared to prior examination. There appears to be a soft tissue attenuation nodule in the posterior left pleural space measuring 5.2 x 2.9 cm (series 3, image 41). Scattered, new nonspecific ground-glass airspace opacities in the anterior right upper lobe (series 4, image 90). Musculoskeletal: No chest wall mass or suspicious bone lesions identified. CT ABDOMEN PELVIS FINDINGS Hepatobiliary: No solid liver abnormality is seen. Calcified sludge or excreted contrast in the gallbladder. No discrete gallstones, gallbladder wall thickening, or biliary dilatation. Pancreas: Redemonstrated diffuse fat stranding and inflammation about the pancreas. There is a large pseudocyst interposed between the greater curvature of the stomach and pancreatic body/tail, slightly increased in size compared to prior examination, measuring 13.2 x 8.9 cm, previously 12.0 x 8.0 cm when measured similarly (series  3, image 55). Spleen: Normal in size without significant abnormality. Adrenals/Urinary Tract: Adrenal glands are unremarkable. Kidneys are normal, without renal calculi, solid lesion, or hydronephrosis. Bladder is unremarkable. Stomach/Bowel: The stomach is displaced and compressed by a large pseudocyst. Appendix appears normal. No evidence of bowel wall thickening, distention, or inflammatory changes. Vascular/Lymphatic: No significant vascular findings are present. No enlarged abdominal or pelvic lymph nodes. Reproductive: No mass or other abnormality. Other: No abdominal wall hernia or abnormality. Small volume ascites throughout the low abdomen and pelvis. Musculoskeletal: No acute or significant osseous findings. IMPRESSION: 1. Redemonstrated diffuse fat stranding and inflammation about the pancreas, consistent with acute pancreatitis. 2. There is a large pseudocyst interposed between the greater curvature of the stomach and pancreatic body/tail, slightly increased in size compared to prior examination, measuring 13.2 x 8.9 cm, previously 12.0 x 8.0 cm when measured similarly. The stomach is displaced and compressed by this large pseudocyst. The presence or absence of infection is not established by CT. 3. The pancreas is not well assessed for parenchymal necrosis by noncontrast CT. 4. There is a moderate to large left pleural effusion and associated atelectasis or consolidation, which is increased compared to prior examination. There appears to be a soft tissue attenuation nodule in the posterior left pleural space measuring 5.2 x 2.9 cm. Given acute development this is most likely clot. 5. Scattered, new nonspecific ground-glass airspace opacities in the anterior right upper lobe. 6. Small volume ascites throughout the low  abdomen and pelvis. Electronically Signed   By: Lauralyn Primes M.D.   On: 10/11/2020 14:35     Medications   Scheduled Meds: . amLODipine  10 mg Oral Daily  . Chlorhexidine Gluconate  Cloth  6 each Topical Q0600  . feeding supplement  1 Container Oral TID BM  . fenofibrate  54 mg Oral Daily  . pantoprazole  40 mg Oral BID  . thiamine  100 mg Oral Daily   Continuous Infusions: . sodium chloride Stopped (10/01/20 1059)  . ondansetron (ZOFRAN) IV Stopped (09/30/20 2014)       LOS: 14 days    Time spent: 30 minutes    Dorcas Carrow, MD [facility]  10/13/2020, 9:34 AM

## 2020-10-14 ENCOUNTER — Encounter: Payer: Self-pay | Admitting: Gastroenterology

## 2020-10-14 DIAGNOSIS — D62 Acute posthemorrhagic anemia: Secondary | ICD-10-CM

## 2020-10-14 DIAGNOSIS — I8510 Secondary esophageal varices without bleeding: Secondary | ICD-10-CM

## 2020-10-14 LAB — CBC WITH DIFFERENTIAL/PLATELET
Abs Immature Granulocytes: 0.48 10*3/uL — ABNORMAL HIGH (ref 0.00–0.07)
Basophils Absolute: 0 10*3/uL (ref 0.0–0.1)
Basophils Relative: 0 %
Eosinophils Absolute: 0 10*3/uL (ref 0.0–0.5)
Eosinophils Relative: 0 %
HCT: 24.2 % — ABNORMAL LOW (ref 39.0–52.0)
Hemoglobin: 8 g/dL — ABNORMAL LOW (ref 13.0–17.0)
Immature Granulocytes: 4 %
Lymphocytes Relative: 16 %
Lymphs Abs: 2.1 10*3/uL (ref 0.7–4.0)
MCH: 31.3 pg (ref 26.0–34.0)
MCHC: 33.1 g/dL (ref 30.0–36.0)
MCV: 94.5 fL (ref 80.0–100.0)
Monocytes Absolute: 1.3 10*3/uL — ABNORMAL HIGH (ref 0.1–1.0)
Monocytes Relative: 10 %
Neutro Abs: 8.9 10*3/uL — ABNORMAL HIGH (ref 1.7–7.7)
Neutrophils Relative %: 70 %
Platelets: 237 10*3/uL (ref 150–400)
RBC: 2.56 MIL/uL — ABNORMAL LOW (ref 4.22–5.81)
RDW: 14.4 % (ref 11.5–15.5)
WBC: 12.9 10*3/uL — ABNORMAL HIGH (ref 4.0–10.5)
nRBC: 0 % (ref 0.0–0.2)

## 2020-10-14 LAB — COMPREHENSIVE METABOLIC PANEL
ALT: 35 U/L (ref 0–44)
AST: 42 U/L — ABNORMAL HIGH (ref 15–41)
Albumin: 1.9 g/dL — ABNORMAL LOW (ref 3.5–5.0)
Alkaline Phosphatase: 76 U/L (ref 38–126)
Anion gap: 8 (ref 5–15)
BUN: 10 mg/dL (ref 6–20)
CO2: 26 mmol/L (ref 22–32)
Calcium: 7.7 mg/dL — ABNORMAL LOW (ref 8.9–10.3)
Chloride: 96 mmol/L — ABNORMAL LOW (ref 98–111)
Creatinine, Ser: 1.06 mg/dL (ref 0.61–1.24)
GFR, Estimated: >60 mL/min (ref >60)
Glucose, Bld: 111 mg/dL — ABNORMAL HIGH (ref 70–99)
Potassium: 3.7 mmol/L (ref 3.5–5.1)
Sodium: 130 mmol/L — ABNORMAL LOW (ref 135–145)
Total Bilirubin: 0.7 mg/dL (ref 0.3–1.2)
Total Protein: 5.7 g/dL — ABNORMAL LOW (ref 6.5–8.1)

## 2020-10-14 MED ORDER — PANTOPRAZOLE SODIUM 40 MG PO TBEC: 40.0000 mg | DELAYED_RELEASE_TABLET | Freq: Two times a day (BID) | ORAL | 2 refills | Status: DC

## 2020-10-14 MED ORDER — OXYCODONE HCL 5 MG PO TABS: 5.0000 mg | ORAL_TABLET | Freq: Four times a day (QID) | ORAL | 0 refills | Status: AC | PRN

## 2020-10-14 MED ORDER — OXYCODONE HCL 5 MG PO TABS: 5.0000 mg | ORAL_TABLET | Freq: Four times a day (QID) | ORAL | 0 refills | Status: DC | PRN

## 2020-10-14 MED ORDER — AMLODIPINE BESYLATE 10 MG PO TABS: 10.0000 mg | ORAL_TABLET | Freq: Every day | ORAL | 2 refills | Status: DC

## 2020-10-14 MED ORDER — FENOFIBRATE 54 MG PO TABS: 54.0000 mg | ORAL_TABLET | Freq: Every day | ORAL | 2 refills | Status: DC

## 2020-10-14 NOTE — Discharge Summary (Signed)
Physician Discharge Summary  Dennis [name] WGN:[id number] DOB: [birth year] DOA: 09/29/2020  PCP: No primary care provider on file.  Admit date: 09/29/2020 Discharge date: 10/14/2020  Admitted From: Home Disposition: Home  Recommendations for Outpatient Follow-up:  1. Follow up with PCP in 1-2 weeks 2. Keep up your appointment with gastroenterologist that scheduled and information attached.  Home Health: Not applicable Equipment/Devices: Not applicable  Discharge Condition: Stable CODE STATUS: Full code Diet recommendation: Low-salt diet, nutritional supplements, mostly liquid and soft food.  Discharge summary: 30 year old male with history of EtOH, tobacco and marijuana use, remote seizures came to the hospital with abdominal pain and diagnosed with acute pancreatitis. 2 weeks ago he was admitted to [facility] ED, diagnosed with alcoholic pancreatitis and discharged home. He has not had any alcohol since. His abdominal pain recurred and came back to the hospital, he was found to have triglycerides of 2400 and was sent to [facility] ICU to be evaluated for possible plasma exchange for hypertriglyceridemia.  Symptomatically improved with insulin and dextrose infusion.  Remained  in the hospital with persistent pain, development of pseudocyst and pneumonia. Patient also developed left lower lobe pneumonia. 5/11, continued to drop hemoglobin.  Black tarry stool.  5/12, underwent upper GI endoscopy with no active bleeding esophageal varices, large pseudocyst compressing on gastric fundus. 5/17 Clinically improving, fever improved. Patient had extensive hospitalization.  His assessment plan is described as bullet points below.  -- Acute blood loss anemia: Baseline hemoglobin 15.  Gradually worsened anemia with hemoglobin as low as 6.2.  Underwent upper GI endoscopy and found to have esophageal varices otherwise no active bleeding.  Varices were banded. Received total 3 units of PRBC and  did very good response.  Currently no evidence of ongoing bleeding. Patient was treated with Protonix and octreotide.  Now with clinical improvement, he will continue on twice a day Protonix by mouth.  -- Acute pancreatitis due to alcohol and hypertriglyceridemia/multiple pseudocyst: Patient remained in the hospital with persistent pain and swelling and abdominal distention. Initial triglyceride was more than 2500, treated with insulin and dextrose and ultimately normalized. Patient currently with normal bowel function, he is eating regular diet and having normal bowel movements.  Occasional pain.  Lipase normalized. Repeated CT scan with significant cysts, large pancreatic pseudocyst with some compression on the fundus of the stomach. Significant compression on the fundus of the stomach, however patient with normal bowel function.  Seen by gastroenterology.  Planning to follow-up, as scheduled below and will need repeat CT scan and assessment for pseudocyst drainage if persistent symptoms. Patient was started on low-dose fenofibrate and is tolerating well, unsure whether triglyceride was related to his alcohol intake however since he is tolerating fibrate's will prescribe. Short course of pain regimen was prescribed for moderate to severe pain.  -- Sepsis due to left lower lobe pneumonia: Patient continues to spike fever.  He developed left lower lobe pneumonia with consolidation.  Thoracentesis was done that did not grow any bacteria.  Ultimately improved.  Patient without any fever for last 24 hours and completed 7 days of antibiotics.  Most of the symptoms improved.  He will go home, increase his mobility and continue to do breathing exercises at home.  -- Hypertension: Previously untreated.  He started on amlodipine that we will continue.  Need reassessment and outpatient follow-up.  -- Electrolyte abnormalities: Replaced and adequately normalized.  Renal functions normalized.  Patient with  adequate improvement.  He is going to quit alcohol altogether.  Very  motivated.  Stable today to go home.      Discharge Diagnoses:  Principal Problem:   Acute pancreatitis Active Problems:   Hypertriglyceridemia   AKI (acute kidney injury) (HCC)   Leukocytosis   Abnormal CT of the abdomen   Melena   Acute blood loss anemia   Esophageal varices without bleeding [facility])    Discharge Instructions  Discharge Instructions    Call MD for:  difficulty breathing, headache or visual disturbances   Complete by: As directed    Call MD for:  persistant nausea and vomiting   Complete by: As directed    Call MD for:  severe uncontrolled pain   Complete by: As directed    Diet - low sodium heart healthy   Complete by: As directed    Discharge instructions   Complete by: As directed    Can use up to 4 tabs a tylenol a day   Use oxycodone for moderate pain. Report back to hospital for severe pain   Increase activity slowly   Complete by: As directed      Allergies as of 10/14/2020   No Known Allergies     Medication List    STOP taking these medications   acyclovir 800 MG tablet Commonly known as: ZOVIRAX   ondansetron 4 MG disintegrating tablet Commonly known as: ZOFRAN-ODT     TAKE these medications   amLODipine 10 MG tablet Commonly known as: NORVASC Take 1 tablet (10 mg total) by mouth daily.   fenofibrate 54 MG tablet Take 1 tablet (54 mg total) by mouth daily.   oxyCODONE 5 MG immediate release tablet Commonly known as: Oxy IR/ROXICODONE Take 1 tablet (5 mg total) by mouth every 6 (six) hours as needed for up to 5 days for moderate pain or severe pain. What changed: reasons to take this   pantoprazole 40 MG tablet Commonly known as: PROTONIX Take 1 tablet (40 mg total) by mouth 2 (two) times daily.       Follow-up Information    Meryl Dare, MD Follow up on 12/02/2020.   Specialty: Gastroenterology Why: 8:50 AM.  Follow-up pancreatitis Contact  information: [address] [place] [address] [phone]        Meredith Pel, NP Follow up on 11/12/2020.   Specialty: Gastroenterology Why: 3 PM follow-up pancreatitis Contact information: [address] [place] [address] [phone]              No Known Allergies  Consultations:  Gastroenterology  Critical care   Procedures/Studies: DG Chest 1 View  Result Date: 10/10/2020 CLINICAL DATA:  Post thoracentesis EXAM: CHEST  1 VIEW COMPARISON:  Chest x-ray 10/06/2020.  Chest CT 10/07/2020. FINDINGS: Worsening moderate left pleural effusion and airspace disease in the left lower lobe. Very low lung volumes. No pneumothorax following thoracentesis. Heart size is accentuated by the low volumes. IMPRESSION: Worsening aeration/lung volumes with increasing left effusion and left lower lobe airspace disease. No pneumothorax. Electronically Signed   By: Charlett Nose M.D.   On: 10/10/2020 10:45   CT CHEST ABDOMEN PELVIS W CONTRAST  Result Date: 10/07/2020 CLINICAL DATA:  Pancreatitis with persistent left lower lobe pneumonia. EXAM: CT CHEST, ABDOMEN, AND PELVIS WITH CONTRAST TECHNIQUE: Multidetector CT imaging of the chest, abdomen and pelvis was performed following the standard protocol during bolus administration of intravenous contrast. CONTRAST:  75mL OMNIPAQUE IOHEXOL 300 MG/ML  SOLN COMPARISON:  Abdomen/pelvis CT 09/28/2020. FINDINGS: CT CHEST FINDINGS Cardiovascular: The heart size is  normal. No substantial pericardial effusion. No thoracic aortic aneurysm. Mediastinum/Nodes: No mediastinal lymphadenopathy. There is no hilar lymphadenopathy. The esophagus has normal imaging features. There is no axillary lymphadenopathy. Lungs/Pleura: Minimal subsegmental atelectasis noted right lower lobe. Left lower lobe collapse/consolidation evident with small to moderate left pleural effusion. Musculoskeletal: No worrisome lytic or sclerotic osseous abnormality. CT ABDOMEN  PELVIS FINDINGS Hepatobiliary: No suspicious focal abnormality within the liver parenchyma. Heterogeneous perfusion in the left liver likely accentuated by periportal edema. High attenuation material in the lumen of the gallbladder may be sludge or vicarious excretion of contrast from prior imaging. Extrahepatic bile ducts not well demonstrated but no definite biliary dilatation. Pancreas: Pancreas is diffusely edematous through the body and tail with no definite non enhancement to suggest pancreatic necrosis 2.1 x 1.8 cm fluid collection in the body of pancreas is new in the interval and may reflect confluent edema or of all vein pseudocyst. There is extensive edema throughout the anterior pararenal space with involvement of the transverse mesocolon, splenic flexure, and proximal descending colon. Spleen: No splenomegaly. No focal mass lesion. Adrenals/Urinary Tract: No adrenal nodule or mass. Kidneys unremarkable. No evidence for hydroureter. The urinary bladder appears normal for the degree of distention. Stomach/Bowel: Stomach is distorted secondary to a large perigastric fluid collection along the greater curvature measuring roughly 12 x 8 x 10 cm. A second perigastric fluid collection is seen along the cranial aspect of the antrum, between the left liver and the gastric antrum measuring approximately 5 x 2 x 8 cm. Duodenum is normally positioned as is the ligament of Treitz. No small bowel wall thickening. No small bowel dilatation. The terminal ileum is normal. The appendix is not well visualized, but there is no edema or inflammation in the region of the cecum. Circumferential wall thickening is noted in the distal transverse colon, splenic flexure, and proximal descending colon. These of the segments surrounded by presumed inflammatory fluid. Sigmoid colon and rectum are unremarkable. Vascular/Lymphatic: No abdominal aortic aneurysm. Celiac axis and SMA are surrounded by edema/inflammation without  complicating feature evident. Portal vein is surrounded by edema/inflammation and appears markedly attenuated/narrowed proximally, but does remain patent. SMV appears widely patent. Splenic vein is not visualized consistent with thrombosis. New venous collateralization is noted in the anterior left abdomen. Small peripancreatic lymph nodes evident with small nodes seen in the hepatoduodenal ligament. No pelvic sidewall lymphadenopathy. Reproductive: No focal mass lesion. No dilatation of the main duct. No intraparenchymal cyst. No peripancreatic edema. Other: Small volume fluid seen adjacent to the inferior liver and inferior spleen. Small volume fluid noted in each paracolic gutter and in the central pelvis. Musculoskeletal: Mild body wall edema evident. No worrisome lytic or sclerotic osseous abnormality. IMPRESSION: 1. Diffuse edema in the body and tail of pancreas with no definite areas of pancreatic necrosis. 2. 2.1 x 1.8 cm fluid collection in the body of pancreas appears to communicate with the peripancreatic fluid/edema. 3. Interval development of large perigastric fluid collection along the greater curvature of the stomach with a second smaller collection along the gastric antrum. While both collections have relatively well-defined margins, neither has a well organized or enhancing rim. Both collections likely reflect evolving pseudocyst. Although no gas is seen in either collection cannot be excluded by CT, superinfection imaging. 4. 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 5. Circumferential wall thickening in the distal transverse colon, splenic flexure, and proximal descending colon. These changes are presumably secondary as these segments of the colon are  surrounded by ill-defined fluid attenuation, likely tracking through the transverse mesocolon from the pancreas. 6. Small volume ascites. 7. Left lower lobe collapse/consolidation with small to moderate left  pleural effusion. 8. Heterogeneous perfusion in the left liver likely accentuated by periportal edema. 9. High attenuation material in the lumen of the gallbladder may be sludge or vicarious excretion of contrast from prior imaging. Electronically Signed   By: Kennith Center M.D.   On: 10/07/2020 15:18   DG CHEST PORT 1 VIEW  Result Date: 10/06/2020 CLINICAL DATA:  Leukocytosis.  Short of breath on exertion. EXAM: PORTABLE CHEST 1 VIEW COMPARISON:  09/30/2020. FINDINGS: There is consolidation at the left lung base obscuring hemidiaphragm. Mild hazy opacity extends to the left peripheral mid lung. Remainder of the lungs is clear. No right pleural effusion.  No pneumothorax. Heart, mediastinum hila are unremarkable. Skeletal structures are grossly intact. IMPRESSION: 1. Left lower lobe consolidation consistent with pneumonia. Possible associated small effusion. Electronically Signed   By: Amie Portland M.D.   On: 10/06/2020 11:17   DG Chest Port 1 View  Result Date: 09/30/2020 CLINICAL DATA:  Kidney. EXAM: PORTABLE CHEST 1 VIEW COMPARISON:  No prior. FINDINGS: Heart size normal. Low lung volumes with bibasilar atelectasis. Mild bibasilar infiltrates cannot be excluded. Tiny left pleural effusion cannot excluded. No pneumothorax. IMPRESSION: Low lung volumes with bibasilar atelectasis. Mild bibasilar infiltrates cannot be excluded. Tiny left pleural effusion cannot be excluded. Electronically Signed   By: Maisie Fus  Register   On: 09/30/2020 06:22   CT CHEST ABDOMEN PELVIS WO CONTRAST  Result Date: 10/11/2020 CLINICAL DATA:  Abdominal distension, persistent fever, pneumonia, complicated pancreatitis EXAM: CT CHEST, ABDOMEN AND PELVIS WITHOUT CONTRAST TECHNIQUE: Multidetector CT imaging of the chest, abdomen and pelvis was performed following the standard protocol without IV contrast. Oral enteric contrast was administered. COMPARISON:  10/07/2020 FINDINGS: CT CHEST FINDINGS Cardiovascular: No significant vascular  findings. Normal heart size. No pericardial effusion. Mediastinum/Nodes: No enlarged mediastinal, hilar, or axillary lymph nodes. Thyroid gland, trachea, and esophagus demonstrate no significant findings. Lungs/Pleura: There is a moderate to large left pleural effusion and associated atelectasis or consolidation, which is increased compared to prior examination. There appears to be a soft tissue attenuation nodule in the posterior left pleural space measuring 5.2 x 2.9 cm (series 3, image 41). Scattered, new nonspecific ground-glass airspace opacities in the anterior right upper lobe (series 4, image 90). Musculoskeletal: No chest wall mass or suspicious bone lesions identified. CT ABDOMEN PELVIS FINDINGS Hepatobiliary: No solid liver abnormality is seen. Calcified sludge or excreted contrast in the gallbladder. No discrete gallstones, gallbladder wall thickening, or biliary dilatation. Pancreas: Redemonstrated diffuse fat stranding and inflammation about the pancreas. There is a large pseudocyst interposed between the greater curvature of the stomach and pancreatic body/tail, slightly increased in size compared to prior examination, measuring 13.2 x 8.9 cm, previously 12.0 x 8.0 cm when measured similarly (series 3, image 55). Spleen: Normal in size without significant abnormality. Adrenals/Urinary Tract: Adrenal glands are unremarkable. Kidneys are normal, without renal calculi, solid lesion, or hydronephrosis. Bladder is unremarkable. Stomach/Bowel: The stomach is displaced and compressed by a large pseudocyst. Appendix appears normal. No evidence of bowel wall thickening, distention, or inflammatory changes. Vascular/Lymphatic: No significant vascular findings are present. No enlarged abdominal or pelvic lymph nodes. Reproductive: No mass or other abnormality. Other: No abdominal wall hernia or abnormality. Small volume ascites throughout the low abdomen and pelvis. Musculoskeletal: No acute or significant  osseous findings. IMPRESSION: 1. Redemonstrated diffuse fat  stranding and inflammation about the pancreas, consistent with acute pancreatitis. 2. There is a large pseudocyst interposed between the greater curvature of the stomach and pancreatic body/tail, slightly increased in size compared to prior examination, measuring 13.2 x 8.9 cm, previously 12.0 x 8.0 cm when measured similarly. The stomach is displaced and compressed by this large pseudocyst. The presence or absence of infection is not established by CT. 3. The pancreas is not well assessed for parenchymal necrosis by noncontrast CT. 4. There is a moderate to large left pleural effusion and associated atelectasis or consolidation, which is increased compared to prior examination. There appears to be a soft tissue attenuation nodule in the posterior left pleural space measuring 5.2 x 2.9 cm. Given acute development this is most likely clot. 5. Scattered, new nonspecific ground-glass airspace opacities in the anterior right upper lobe. 6. Small volume ascites throughout the low abdomen and pelvis. Electronically Signed   By: Lauralyn Primes M.D.   On: 10/11/2020 14:35   IR THORACENTESIS ASP PLEURAL SPACE W/IMG GUIDE  Result Date: 10/10/2020 INDICATION: History of pancreatitis found to have pneumonia with left-sided pleural effusion. Request is for therapeutic and diagnostic thoracentesis EXAM: ULTRASOUND GUIDED THERAPEUTIC AND DIAGNOSTIC LEFT-SIDED THORACENTESIS MEDICATIONS: Lidocaine 1% 10 mL COMPLICATIONS: None immediate. PROCEDURE: An ultrasound guided thoracentesis was thoroughly discussed with the patient and questions answered. The benefits, risks, alternatives and complications were also discussed. The patient understands and wishes to proceed with the procedure. Written consent was obtained. Ultrasound was performed to localize and mark an adequate pocket of fluid in the left chest. The area was then prepped and draped in the normal sterile fashion.  1% Lidocaine was used for local anesthesia. Under ultrasound guidance a 6 Fr Safe-T-Centesis catheter was introduced. Thoracentesis was performed. The catheter was removed and a dressing applied. Patient unable to tolerate additional fluid removal at this time. FINDINGS: A total of approximately 500 mL of amber colored fluid was removed. Samples were sent to the laboratory as requested by the clinical team. IMPRESSION: Successful ultrasound guided therapeutic and diagnostic left-sided thoracentesis yielding 500 mL of pleural fluid. Read by: Anders Grant, NP Electronically Signed   By: Simonne Come M.D.   On: 10/10/2020 11:16   (Echo, Carotid, EGD, Colonoscopy, ERCP)    Subjective: Patient seen and examined.  Denies any complaints.  He is eating regular diet.  Has some flank pain but not using any pain medications.  Afebrile for last 24 hours. Eager to go home to be with his small children.   Discharge Exam: Vitals:   10/14/20 0350 10/14/20 0826  BP: (!) 160/88 (!) 142/81  Pulse:    Resp: 20 20  Temp: 98.4 F (36.9 C) 98.4 F (36.9 C)  SpO2: 100% 100%   Vitals:   10/13/20 2024 10/14/20 0016 10/14/20 0350 10/14/20 0826  BP: (!) 153/82 131/75 (!) 160/88 (!) 142/81  Pulse:      Resp: Temp: 99.9 F (37.7 C) 99.3 F (37.4 C) 98.4 F (36.9 C) 98.4 F (36.9 C)  TempSrc: Oral Oral Oral Oral  SpO2: 100% 100% 100% 100%  Weight:      Height:        General: Pt is alert, awake, not in acute distress Walking around in the hallway. Cardiovascular: RRR, S1/S2 +, no rubs, no gallops Respiratory: CTA bilaterally, no wheezing, no rhonchi Abdominal: Soft, distended.  Mildly tender all over, no rigidity or guarding.  Bowel sounds present. Extremities: no edema, no cyanosis  The results of significant diagnostics from this hospitalization (including imaging, microbiology, ancillary and laboratory) are listed below for reference.     Microbiology: Recent Results (from  the past 240 hour(s))  Culture, blood (routine x 2) Call MD if unable to obtain prior to antibiotics being given     Status: None   Collection Time: 10/07/20  9:24 AM   Specimen: BLOOD  Result Value Ref Range Status   Specimen Description BLOOD RIGHT ANTECUBITAL  Final   Special Requests   Final    BOTTLES DRAWN AEROBIC AND ANAEROBIC Blood Culture results may not be optimal due to an inadequate volume of blood received in culture bottles   Culture   Final    NO GROWTH 5 DAYS Performed at [facility] Lab, [address] [place], [place] [address]    Report Status 10/12/2020 FINAL  Final  Culture, blood (routine x 2) Call MD if unable to obtain prior to antibiotics being given     Status: None   Collection Time: 10/07/20  9:24 AM   Specimen: BLOOD RIGHT FOREARM  Result Value Ref Range Status   Specimen Description BLOOD RIGHT FOREARM  Final   Special Requests   Final    BOTTLES DRAWN AEROBIC AND ANAEROBIC Blood Culture results may not be optimal due to an inadequate volume of blood received in culture bottles   Culture   Final    NO GROWTH 5 DAYS Performed at [facility], [address], [place], [place] [address]    Report Status 10/12/2020 FINAL  Final  Resp Panel by RT-PCR (Flu A&B, Covid) Nasopharyngeal Swab     Status: None   Collection Time: 10/09/20 10:36 AM   Specimen: Nasopharyngeal Swab; Nasopharyngeal(NP) swabs in vial transport medium  Result Value Ref Range Status   SARS Coronavirus 2 by RT PCR NEGATIVE NEGATIVE Final    Comment: (NOTE) SARS-CoV-2 target nucleic acids are NOT DETECTED.  The SARS-CoV-2 RNA is generally detectable in upper respiratory specimens during the acute phase of infection. The lowest concentration of SARS-CoV-2 viral copies this assay can detect is 138 copies/mL. A negative result does not preclude SARS-Cov-2 infection and should not be used as the sole basis for treatment or other patient management decisions. A negative result may  occur with  improper specimen collection/handling, submission of specimen other than nasopharyngeal swab, presence of viral mutation(s) within the areas targeted by this assay, and inadequate number of viral copies(<138 copies/mL). A negative result must be combined with clinical observations, patient history, and epidemiological information. The expected result is Negative.  Fact Sheet for Patients:  BloggerCourse.com  Fact Sheet for Healthcare Providers:  SeriousBroker.it  This test is no t yet approved or cleared by the [place] FDA and  has been authorized for detection and/or diagnosis of SARS-CoV-2 by FDA under an Emergency Use Authorization (EUA). This EUA will remain  in effect (meaning this test can be used) for the duration of the COVID-19 declaration under Section 564(b)(1) of the Act, 21 U.S.C.section 360bbb-3(b)(1), unless the authorization is terminated  or revoked sooner.       Influenza A by PCR NEGATIVE NEGATIVE Final   Influenza B by PCR NEGATIVE NEGATIVE Final    Comment: (NOTE) The Xpert Xpress SARS-CoV-2/FLU/RSV plus assay is intended as an aid in the diagnosis of influenza from Nasopharyngeal swab specimens and should not be used as a sole basis for treatment. Nasal washings and aspirates are unacceptable for Xpert Xpress SARS-CoV-2/FLU/RSV testing.  Fact Sheet for Patients: BloggerCourse.com  Fact Sheet for Healthcare Providers: SeriousBroker.it  This test is not yet approved or cleared by the [place] FDA and has been authorized for detection and/or diagnosis of SARS-CoV-2 by FDA under an Emergency Use Authorization (EUA). This EUA will remain in effect (meaning this test can be used) for the duration of the COVID-19 declaration under Section 564(b)(1) of the Act, 21 U.S.C. section 360bbb-3(b)(1), unless the authorization is terminated  or revoked.  Performed at [facility], [address] [place], [place] [address]   Body fluid culture w Gram Stain     Status: None   Collection Time: 10/10/20 10:37 AM   Specimen: Lung, Left; Pleural Fluid  Result Value Ref Range Status   Specimen Description PLEURAL FLUID  Final   Special Requests LUNG LEFT  Final   Gram Stain   Final    FEW WBC PRESENT,BOTH PMN AND MONONUCLEAR NO ORGANISMS SEEN    Culture   Final    NO GROWTH 3 DAYS Performed at [facility] Lab, [address] [place], [place] [address]    Report Status 10/13/2020 FINAL  Final     Labs: BNP (last 3 results) No results for input(s): BNP in the last 8760 hours. Basic Metabolic Panel: Recent Labs  Lab 10/09/20 0136 10/09/20 1331 10/10/20 0432 10/11/20 0358 10/12/20 0127 10/14/20 0101  NA 130* 131* 130* 132* 129* 130*  K 3.8 3.9 3.9 4.2 4.3 3.7  CL 96* 98 96* 96* 95* 96*  CO2 25  --  GLUCOSE 138* 103* 154* 113* 119* 111*  BUN 26* CREATININE 0.97 0.80 1.02 1.03 1.07 1.06  CALCIUM 7.9*  --  7.9* 7.9* 7.7* 7.7*  MG 2.0  --   --  2.0  --   --   PHOS 3.8  --   --   --   --   --    Liver Function Tests: Recent Labs  Lab 10/09/20 0136 10/10/20 0432 10/11/20 0358 10/12/20 0127 10/14/20 0101  AST 36 37 32 29 42*  ALT 35 34 29 25 35  ALKPHOS 112 102 86 78 76  BILITOT 0.4 0.7 0.8 0.6 0.7  PROT 5.3* 5.7* 5.6* 5.3* 5.7*  ALBUMIN 1.9* 2.0* 1.9* 1.8* 1.9*   Recent Labs  Lab 10/08/20 0441  LIPASE 69*   No results for input(s): AMMONIA in the last 168 hours. CBC: Recent Labs  Lab 10/09/20 0136 10/09/20 0257 10/09/20 1331 10/11/20 0358 10/12/20 0127 10/13/20 0204 10/14/20 0101  WBC 15.5*  --   --  17.1* 14.9* 12.1* 12.9*  NEUTROABS 13.0*  --   --  12.8* 10.9* 8.6* 8.9*  HGB 6.3*   < > 7.8* 7.2* 6.9* 8.1* 8.0*  HCT 19.2*   < > 23.0* 21.7* 20.7* 24.7* 24.2*  MCV 97.5  --   --  96.4 96.3 94.3 94.5  PLT 213  --   --  224 222 227 237   < > = values in  this interval not displayed.   Cardiac Enzymes: No results for input(s): CKTOTAL, CKMB, CKMBINDEX, TROPONINI in the last 168 hours. BNP: Invalid input(s): POCBNP CBG: No results for input(s): GLUCAP in the last 168 hours. D-Dimer No results for input(s): DDIMER in the last 72 hours. Hgb A1c No results for input(s): HGBA1C in the last 72 hours. Lipid Profile No results for input(s): CHOL, HDL, LDLCALC, TRIG, CHOLHDL, LDLDIRECT in the  last 72 hours. Thyroid function studies No results for input(s): TSH, T4TOTAL, T3FREE, THYROIDAB in the last 72 hours.  Invalid input(s): FREET3 Anemia work up No results for input(s): VITAMINB12, FOLATE, FERRITIN, TIBC, IRON, RETICCTPCT in the last 72 hours. Urinalysis No results found for: COLORURINE, APPEARANCEUR, LABSPEC, PHURINE, GLUCOSEU, HGBUR, BILIRUBINUR, KETONESUR, PROTEINUR, UROBILINOGEN, NITRITE, LEUKOCYTESUR Sepsis Labs Invalid input(s): PROCALCITONIN,  WBC,  LACTICIDVEN Microbiology Recent Results (from the past 240 hour(s))  Culture, blood (routine x 2) Call MD if unable to obtain prior to antibiotics being given     Status: None   Collection Time: 10/07/20  9:24 AM   Specimen: BLOOD  Result Value Ref Range Status   Specimen Description BLOOD RIGHT ANTECUBITAL  Final   Special Requests   Final    BOTTLES DRAWN AEROBIC AND ANAEROBIC Blood Culture results may not be optimal due to an inadequate volume of blood received in culture bottles   Culture   Final    NO GROWTH 5 DAYS Performed at [facility], [address] [place], [place] [address]    Report Status 10/12/2020 FINAL  Final  Culture, blood (routine x 2) Call MD if unable to obtain prior to antibiotics being given     Status: None   Collection Time: 10/07/20  9:24 AM   Specimen: BLOOD RIGHT FOREARM  Result Value Ref Range Status   Specimen Description BLOOD RIGHT FOREARM  Final   Special Requests   Final    BOTTLES DRAWN AEROBIC AND ANAEROBIC Blood Culture results  may not be optimal due to an inadequate volume of blood received in culture bottles   Culture   Final    NO GROWTH 5 DAYS Performed at [facility], [address] [place], [place] [address]    Report Status 10/12/2020 FINAL  Final  Resp Panel by RT-PCR (Flu A&B, Covid) Nasopharyngeal Swab     Status: None   Collection Time: 10/09/20 10:36 AM   Specimen: Nasopharyngeal Swab; Nasopharyngeal(NP) swabs in vial transport medium  Result Value Ref Range Status   SARS Coronavirus 2 by RT PCR NEGATIVE NEGATIVE Final    Comment: (NOTE) SARS-CoV-2 target nucleic acids are NOT DETECTED.  The SARS-CoV-2 RNA is generally detectable in upper respiratory specimens during the acute phase of infection. The lowest concentration of SARS-CoV-2 viral copies this assay can detect is 138 copies/mL. A negative result does not preclude SARS-Cov-2 infection and should not be used as the sole basis for treatment or other patient management decisions. A negative result may occur with  improper specimen collection/handling, submission of specimen other than nasopharyngeal swab, presence of viral mutation(s) within the areas targeted by this assay, and inadequate number of viral copies(<138 copies/mL). A negative result must be combined with clinical observations, patient history, and epidemiological information. The expected result is Negative.  Fact Sheet for Patients:  BloggerCourse.comhttps://www.fda.gov/media/152166/download  Fact Sheet for Healthcare Providers:  SeriousBroker.ithttps://www.fda.gov/media/152162/download  This test is no t yet approved or cleared by the [place] FDA and  has been authorized for detection and/or diagnosis of SARS-CoV-2 by FDA under an Emergency Use Authorization (EUA). This EUA will remain  in effect (meaning this test can be used) for the duration of the COVID-19 declaration under Section 564(b)(1) of the Act, 21 U.S.C.section 360bbb-3(b)(1), unless the authorization is terminated  or  revoked sooner.       Influenza A by PCR NEGATIVE NEGATIVE Final   Influenza B by PCR NEGATIVE NEGATIVE Final    Comment: (  NOTE) The Xpert Xpress SARS-CoV-2/FLU/RSV plus assay is intended as an aid in the diagnosis of influenza from Nasopharyngeal swab specimens and should not be used as a sole basis for treatment. Nasal washings and aspirates are unacceptable for Xpert Xpress SARS-CoV-2/FLU/RSV testing.  Fact Sheet for Patients: BloggerCourse.com  Fact Sheet for Healthcare Providers: SeriousBroker.it  This test is not yet approved or cleared by the [place] FDA and has been authorized for detection and/or diagnosis of SARS-CoV-2 by FDA under an Emergency Use Authorization (EUA). This EUA will remain in effect (meaning this test can be used) for the duration of the COVID-19 declaration under Section 564(b)(1) of the Act, 21 U.S.C. section 360bbb-3(b)(1), unless the authorization is terminated or revoked.  Performed at [facility], [address] [place], [place] [address]   Body fluid culture w Gram Stain     Status: None   Collection Time: 10/10/20 10:37 AM   Specimen: Lung, Left; Pleural Fluid  Result Value Ref Range Status   Specimen Description PLEURAL FLUID  Final   Special Requests LUNG LEFT  Final   Gram Stain   Final    FEW WBC PRESENT,BOTH PMN AND MONONUCLEAR NO ORGANISMS SEEN    Culture   Final    NO GROWTH 3 DAYS Performed at [facility], [address] [place], [place] [address]    Report Status 10/13/2020 FINAL  Final     Time coordinating discharge: 45 minutes  SIGNED:   Dorcas Carrow, MD  [facility] 10/14/2020, 10:47 AM

## 2020-10-14 NOTE — Progress Notes (Signed)
Patient was discharged and walked to his car with all of his belongings with him.   Mikki Harbor, RN

## 2020-10-15 NOTE — Telephone Encounter (Signed)
Left message for patient to return my call.

## 2020-10-16 NOTE — Telephone Encounter (Signed)
Left message for patient to return my call.

## 2020-10-20 NOTE — Telephone Encounter (Signed)
Left message for patient to return my call. Will send letter to patient. 

## 2020-11-12 ENCOUNTER — Ambulatory Visit: Payer: Medicaid Other | Admitting: Nurse Practitioner

## 2020-11-12 ENCOUNTER — Telehealth: Payer: Self-pay | Admitting: *Deleted

## 2020-11-12 ENCOUNTER — Other Ambulatory Visit: Payer: Self-pay

## 2020-11-12 ENCOUNTER — Encounter: Payer: Self-pay | Admitting: Gastroenterology

## 2020-11-12 ENCOUNTER — Ambulatory Visit (INDEPENDENT_AMBULATORY_CARE_PROVIDER_SITE_OTHER): Payer: Medicaid Other | Admitting: Gastroenterology

## 2020-11-12 VITALS — BP 132/88 | HR 101 | Ht 72.0 in | Wt 189.0 lb

## 2020-11-12 DIAGNOSIS — K852 Alcohol induced acute pancreatitis without necrosis or infection: Secondary | ICD-10-CM | POA: Diagnosis not present

## 2020-11-12 DIAGNOSIS — R0600 Dyspnea, unspecified: Secondary | ICD-10-CM | POA: Diagnosis not present

## 2020-11-12 DIAGNOSIS — I8500 Esophageal varices without bleeding: Secondary | ICD-10-CM | POA: Diagnosis not present

## 2020-11-12 NOTE — H&P (View-Only) (Signed)
11/12/2020 Dennis [name] [id number] 1990-07-03   HISTORY OF PRESENT ILLNESS: This is a 30 year old male who is known to our service for evaluation and treatment of acute pancreatitis with fluid collections during recent hospitalization.  Initially this was thought to be alcohol related, but then triglycerides were also found to be over 2400.  Most recent CT scan in our system was from Oct 11, 2020 without contrast that showed the following:   IMPRESSION: 1. Redemonstrated diffuse fat stranding and inflammation about the pancreas, consistent with acute pancreatitis. 2. There is a large pseudocyst interposed between the greater curvature of the stomach and pancreatic body/tail, slightly increased in size compared to prior examination, measuring 13.2 x 8.9 cm, previously 12.0 x 8.0 cm when measured similarly. The stomach is displaced and compressed by this large pseudocyst. The presence or absence of infection is not established by CT. 3. The pancreas is not well assessed for parenchymal necrosis by noncontrast CT. 4. There is a moderate to large left pleural effusion and associated atelectasis or consolidation, which is increased compared to prior examination. There appears to be a soft tissue attenuation nodule in the posterior left pleural space measuring 5.2 x 2.9 cm. Given acute development this is most likely clot. 5. Scattered, new nonspecific ground-glass airspace opacities in the anterior right upper lobe. 6. Small volume ascites throughout the low abdomen and pelvis.   He underwent EGD on 10/09/2020:  - Grade II esophageal varices. Completely eradicated. Banded. - Extrinsic compression in the gastric fundus and in the gastric body (greater curvature, posterior wall). - Extrinsic compression in the gastric antrum (lesser curvature). - Duodenal erosions. Biopsied.  Biopsies showed mild nonspecific duodenitis consistent with peptic injury and no features of  celiac sprue or granulomas.  While he was there he also had a dip in his hemoglobin down to 6.9 g and was transfused with appropriate response.  He was Hemoccult positive and is having black stools.  EGD as above.  Plan was for repeat EGD in 3 to 6 weeks for any further banding if needed.  He is here today for hospital follow-up.  He says he is feeling well.  He is having little to no abdominal pain.  He says that he is eating well.  His stools are normal without signs of bleeding including black or bloody color.  He says that he was at the emergency department at [facility] a couple of weeks ago and they did a CT scan and some labs.  He says he was having a little bit of discomfort and just got worried.  He tells me that over the past few days he has noticed some difficulty with getting a full/deep breath like when he yawns.  He also had a pleural effusion requiring thoracentesis with 500 mL removed during his hospital stay and was concerned if that has recurred.  He tells me that he has not had any ETOH in 3 months.  He remains on pantoprazole 40 mg BID.    Past Medical History:  Diagnosis Date   Hypertension    Pancreatitis    Past Surgical History:  Procedure Laterality Date   BIOPSY  10/09/2020   Procedure: BIOPSY;  Surgeon: Meryl Dare, MD;  Location: [facility] ENDOSCOPY;  Service: Endoscopy;;   ESOPHAGEAL BANDING  10/09/2020   Procedure: ESOPHAGEAL BANDING;  Surgeon: Meryl Dare, MD;  [facility] ENDOSCOPY;  Service: Endoscopy;;   ESOPHAGOGASTRODUODENOSCOPY (EGD) WITH PROPOFOL N/A 10/09/2020   Procedure: ESOPHAGOGASTRODUODENOSCOPY (EGD)  WITH PROPOFOL;  Surgeon: Meryl Dare, MD;  Location: [facility] ENDOSCOPY;  Service: Endoscopy;  Laterality: N/A;   IR THORACENTESIS ASP PLEURAL SPACE W/IMG GUIDE  10/10/2020    reports previous alcohol use. No history on file for tobacco use and drug use. family history includes Pancreatitis in his mother. No Known Allergies    Outpatient  Encounter Medications as of 11/12/2020  Medication Sig   amLODipine (NORVASC) 10 MG tablet Take 1 tablet (10 mg total) by mouth daily.   fenofibrate 54 MG tablet Take 1 tablet (54 mg total) by mouth daily.   pantoprazole (PROTONIX) 40 MG tablet Take 1 tablet (40 mg total) by mouth 2 (two) times daily.   No facility-administered encounter medications on file as of 11/12/2020.     REVIEW OF SYSTEMS  : All other systems reviewed and negative except where noted in the History of Present Illness.   PHYSICAL EXAM: BP 132/88   Pulse (!) 101   Ht 6' (1.829 m)   Wt 189 lb (85.7 kg)   BMI 25.63 kg/m  General: Well developed white male in no acute distress Head: Normocephalic and atraumatic Eyes:  Sclerae anicteric, conjunctiva pink. Ears: Normal auditory acuity Lungs: Clear throughout to auscultation; no W/R/R. Heart: Slightly tachy but regular rhythm; no M/R/G. Abdomen: Soft, non-distended but there was firmness and fullness in LUQ/left epigastrium likely from pseudocyst/fluid collections.  BS present.  Non-tender. Musculoskeletal: Symmetrical with no gross deformities  Skin: No lesions on visible extremities Extremities: No edema  Neurological: Alert oriented x 4, grossly non-focal. Psychological:  Alert and cooperative. Normal mood and affect  ASSESSMENT AND PLAN: *Acute recurrent pancreatitis initially presenting in April 2022.  Initially attributed to alcohol but then on most recent presentation his triglycerides were over 2400.  He tells me that has not had any alcohol in about 3 months.  Had large perigastric fluid collection and other smaller collection along the gastric antrum both which were not well organized but likely evolving pseudocysts.  Currently he is symptomatically much improved.  Having a little difficulty taking a full breath.  Had pleural effusion with thoracentesis of 500 mLs in the hospital. *Splenic vein thrombosis with collaterals *Normocytic anemia with hemoglobin  down to 6.9 g, transfuse packed red blood cells and hemoglobin improved appropriately.  Was having dark stools and was FOBT positive in the hospital.  He has not had any further overt signs of GI bleeding. *Grade 2 nonbleeding esophageal varices that were eradicated with bands on 10/09/2020 during EGD.  He also had extrinsic compression in the gastric fundus, body, and antrum from extrinsic fluid collections.  Had duodenal erosions.  Biopsies unremarkable.  **He was seen emergency room at [facility] about 2 weeks ago where he tells me they repeated a CT scan and labs.  We will try to obtain these for review. **He needs repeat EGD to evaluate for any further varices.  Plan was to do this in about 3 to 6 weeks.  He was actually able to get on the schedule with Dr. Russella Dar for next week. **He was congratulated on his alcohol abstinence and was encouraged to continue to remain alcohol free. **Will check chest-xray today to look for any sign of recurrent effusion.  **Addendum: We obtained CT scan results from [facility].  He had a CT scan of the abdomen and pelvis with contrast performed on May 25.  This showed interval development of a trace to small volume left empyema with associated left lower lobe consolidation  that may represent atelectasis with superimposed infection/inflammation not excluded.  It also showed persistent acute pancreatitis with grossly similar appearing markedly irregular and lobulated organized fluid collection surrounding the pancreas with the largest pockets noted along the greater curvature of the gastric lumen measuring up to 10 cm mass-effect on the gastric lumen noted by the pseudocyst.  Likely reactive bowel wall thickening transverse, left, and rectosigmoid colon in the setting of surrounding pancreatic organized fluid collections along the transverse colon and splenic flexure.  Diminutive and flattened main portal vein posterior to the proximal pancreas with underlying  nonocclusive thrombosis not excluded.  Also noted to have hepatomegaly and aortic atherosclerosis.  Records sent to be scanned.   CC:  No ref. provider found

## 2020-11-12 NOTE — Patient Instructions (Signed)
You have been scheduled for an endoscopy. Please follow written instructions given to you at your visit today. If you use inhalers (even only as needed), please bring them with you on the day of your procedure.  If you are age 30 or older, your body mass index should be between 23-30. Your Body mass index is 25.63 kg/m. If this is out of the aforementioned range listed, please consider follow up with your Primary Care Provider.  If you are age 9 or younger, your body mass index should be between 19-25. Your Body mass index is 25.63 kg/m. If this is out of the aformentioned range listed, please consider follow up with your Primary Care Provider.   __________________________________________________________  The [place] [facility] providers would like to encourage you to use [facility] to communicate with providers for non-urgent requests or questions.  Due to long hold times on the telephone, sending your provider a message by [facility] may be a faster and more efficient way to get a response.  Please allow 48 business hours for a response.  Please remember that this is for non-urgent requests.

## 2020-11-12 NOTE — Progress Notes (Addendum)
11/12/2020 Jasani Dolney [id number] 1990-07-03   HISTORY OF PRESENT ILLNESS: This is a 30 year old male who is known to our service for evaluation and treatment of acute pancreatitis with fluid collections during recent hospitalization.  Initially this was thought to be alcohol related, but then triglycerides were also found to be over 2400.  Most recent CT scan in our system was from Oct 11, 2020 without contrast that showed the following:   IMPRESSION: 1. Redemonstrated diffuse fat stranding and inflammation about the pancreas, consistent with acute pancreatitis. 2. There is a large pseudocyst interposed between the greater curvature of the stomach and pancreatic body/tail, slightly increased in size compared to prior examination, measuring 13.2 x 8.9 cm, previously 12.0 x 8.0 cm when measured similarly. The stomach is displaced and compressed by this large pseudocyst. The presence or absence of infection is not established by CT. 3. The pancreas is not well assessed for parenchymal necrosis by noncontrast CT. 4. There is a moderate to large left pleural effusion and associated atelectasis or consolidation, which is increased compared to prior examination. There appears to be a soft tissue attenuation nodule in the posterior left pleural space measuring 5.2 x 2.9 cm. Given acute development this is most likely clot. 5. Scattered, new nonspecific ground-glass airspace opacities in the anterior right upper lobe. 6. Small volume ascites throughout the low abdomen and pelvis.   He underwent EGD on 10/09/2020:  - Grade II esophageal varices. Completely eradicated. Banded. - Extrinsic compression in the gastric fundus and in the gastric body (greater curvature, posterior wall). - Extrinsic compression in the gastric antrum (lesser curvature). - Duodenal erosions. Biopsied.  Biopsies showed mild nonspecific duodenitis consistent with peptic injury and no features of  celiac sprue or granulomas.  While he was there he also had a dip in his hemoglobin down to 6.9 g and was transfused with appropriate response.  He was Hemoccult positive and is having black stools.  EGD as above.  Plan was for repeat EGD in 3 to 6 weeks for any further banding if needed.  He is here today for hospital follow-up.  He says he is feeling well.  He is having little to no abdominal pain.  He says that he is eating well.  His stools are normal without signs of bleeding including black or bloody color.  He says that he was at the emergency department at [facility] a couple of weeks ago and they did a CT scan and some labs.  He says he was having a little bit of discomfort and just got worried.  He tells me that over the past few days he has noticed some difficulty with getting a full/deep breath like when he yawns.  He also had a pleural effusion requiring thoracentesis with 500 mL removed during his hospital stay and was concerned if that has recurred.  He tells me that he has not had any ETOH in 3 months.  He remains on pantoprazole 40 mg BID.    Past Medical History:  Diagnosis Date   Hypertension    Pancreatitis    Past Surgical History:  Procedure Laterality Date   BIOPSY  10/09/2020   Procedure: BIOPSY;  Surgeon: Meryl Dare, MD;  Location: [facility] ENDOSCOPY;  Service: Endoscopy;;   ESOPHAGEAL BANDING  10/09/2020   Procedure: ESOPHAGEAL BANDING;  Surgeon: Meryl Dare, MD;  Location[facility] ENDOSCOPY;  Service: Endoscopy;;   ESOPHAGOGASTRODUODENOSCOPY (EGD) WITH PROPOFOL N/A 10/09/2020   Procedure: ESOPHAGOGASTRODUODENOSCOPY (EGD)  WITH PROPOFOL;  Surgeon: Meryl Dare, MD;  Location: [facility] ENDOSCOPY;  Service: Endoscopy;  Laterality: N/A;   IR THORACENTESIS ASP PLEURAL SPACE W/IMG GUIDE  10/10/2020    reports previous alcohol use. No history on file for tobacco use and drug use. family history includes Pancreatitis in his mother. No Known Allergies    Outpatient  Encounter Medications as of 11/12/2020  Medication Sig   amLODipine (NORVASC) 10 MG tablet Take 1 tablet (10 mg total) by mouth daily.   fenofibrate 54 MG tablet Take 1 tablet (54 mg total) by mouth daily.   pantoprazole (PROTONIX) 40 MG tablet Take 1 tablet (40 mg total) by mouth 2 (two) times daily.   No facility-administered encounter medications on file as of 11/12/2020.     REVIEW OF SYSTEMS  : All other systems reviewed and negative except where noted in the History of Present Illness.   PHYSICAL EXAM: BP 132/88   Pulse (!) 101   Ht 6' (1.829 m)   Wt 189 lb (85.7 kg)   BMI 25.63 kg/m  General: Well developed white male in no acute distress Head: Normocephalic and atraumatic Eyes:  Sclerae anicteric, conjunctiva pink. Ears: Normal auditory acuity Lungs: Clear throughout to auscultation; no W/R/R. Heart: Slightly tachy but regular rhythm; no M/R/G. Abdomen: Soft, non-distended but there was firmness and fullness in LUQ/left epigastrium likely from pseudocyst/fluid collections.  BS present.  Non-tender. Musculoskeletal: Symmetrical with no gross deformities  Skin: No lesions on visible extremities Extremities: No edema  Neurological: Alert oriented x 4, grossly non-focal. Psychological:  Alert and cooperative. Normal mood and affect  ASSESSMENT AND PLAN: *Acute recurrent pancreatitis initially presenting in April 2022.  Initially attributed to alcohol but then on most recent presentation his triglycerides were over 2400.  He tells me that has not had any alcohol in about 3 months.  Had large perigastric fluid collection and other smaller collection along the gastric antrum both which were not well organized but likely evolving pseudocysts.  Currently he is symptomatically much improved.  Having a little difficulty taking a full breath.  Had pleural effusion with thoracentesis of 500 mLs in the hospital. *Splenic vein thrombosis with collaterals *Normocytic anemia with hemoglobin  down to 6.9 g, transfuse packed red blood cells and hemoglobin improved appropriately.  Was having dark stools and was FOBT positive in the hospital.  He has not had any further overt signs of GI bleeding. *Grade 2 nonbleeding esophageal varices that were eradicated with bands on 10/09/2020 during EGD.  He also had extrinsic compression in the gastric fundus, body, and antrum from extrinsic fluid collections.  Had duodenal erosions.  Biopsies unremarkable.  **He was seen emergency room at [facility] about 2 weeks ago where he tells me they repeated a CT scan and labs.  We will try to obtain these for review. **He needs repeat EGD to evaluate for any further varices.  Plan was to do this in about 3 to 6 weeks.  He was actually able to get on the schedule with Dr. Russella Dar for next week. **He was congratulated on his alcohol abstinence and was encouraged to continue to remain alcohol free. **Will check chest-xray today to look for any sign of recurrent effusion.  **Addendum: We obtained CT scan results from [facility].  He had a CT scan of the abdomen and pelvis with contrast performed on May 25.  This showed interval development of a trace to small volume left empyema with associated left lower lobe consolidation  that may represent atelectasis with superimposed infection/inflammation not excluded.  It also showed persistent acute pancreatitis with grossly similar appearing markedly irregular and lobulated organized fluid collection surrounding the pancreas with the largest pockets noted along the greater curvature of the gastric lumen measuring up to 10 cm mass-effect on the gastric lumen noted by the pseudocyst.  Likely reactive bowel wall thickening transverse, left, and rectosigmoid colon in the setting of surrounding pancreatic organized fluid collections along the transverse colon and splenic flexure.  Diminutive and flattened main portal vein posterior to the proximal pancreas with underlying  nonocclusive thrombosis not excluded.  Also noted to have hepatomegaly and aortic atherosclerosis.  Records sent to be scanned.   CC:  No ref. provider found

## 2020-11-12 NOTE — Progress Notes (Signed)
Reviewed and agree with management plan.  Arlo Butt T. Loomis Anacker, MD FACG [phone]  

## 2020-11-12 NOTE — Telephone Encounter (Signed)
Left message for patient to call office.  

## 2020-11-12 NOTE — Telephone Encounter (Signed)
-----   Message from Leta Baptist, PA-C sent at 11/12/2020  2:12 PM EDT ----- Please let him know that he probably does not need that chest x-ray repeated right now if he has not come in yet.  They did 1 when he was at [facility] and they also were able to see his lower lungs on the CT scan that they performed.  It still showed some fluid around that lung.  He needs to follow-up with pulmonary soon if he is not but already been arranged to do so.

## 2020-11-12 NOTE — Progress Notes (Signed)
Attempted to obtain medical history via telephone, unable to reach at this time. I left a voicemail to return pre surgical testing department's phone call.  

## 2020-11-13 ENCOUNTER — Other Ambulatory Visit (HOSPITAL_COMMUNITY): Payer: Self-pay

## 2020-11-13 NOTE — Addendum Note (Signed)
Addended by: Mariane Duval on: 11/13/2020 02:18 PM   Modules accepted: Orders

## 2020-11-17 ENCOUNTER — Ambulatory Visit (HOSPITAL_COMMUNITY)
Admission: RE | Admit: 2020-11-17 | Discharge: 2020-11-17 | Disposition: A | Discharge status: Home / Self Care | Payer: Medicaid Other | Attending: Gastroenterology | Admitting: Gastroenterology

## 2020-11-17 ENCOUNTER — Encounter (HOSPITAL_COMMUNITY): Admission: RE | Payer: Self-pay | Source: Home / Self Care

## 2020-11-17 ENCOUNTER — Ambulatory Visit (HOSPITAL_COMMUNITY): Payer: Medicaid Other | Admitting: Certified Registered Nurse Anesthetist

## 2020-11-17 ENCOUNTER — Encounter (HOSPITAL_COMMUNITY): Payer: Self-pay | Admitting: Gastroenterology

## 2020-11-17 ENCOUNTER — Ambulatory Visit (HOSPITAL_COMMUNITY): Admission: RE | Admit: 2020-11-17 | Payer: Medicaid Other | Source: Home / Self Care | Admitting: Gastroenterology

## 2020-11-17 ENCOUNTER — Encounter (HOSPITAL_COMMUNITY)
Admission: RE | Disposition: A | Discharge status: Home / Self Care | Payer: Self-pay | Source: Home / Self Care | Attending: Gastroenterology

## 2020-11-17 DIAGNOSIS — I8500 Esophageal varices without bleeding: Secondary | ICD-10-CM | POA: Insufficient documentation

## 2020-11-17 DIAGNOSIS — K3189 Other diseases of stomach and duodenum: Secondary | ICD-10-CM | POA: Diagnosis not present

## 2020-11-17 DIAGNOSIS — I8510 Secondary esophageal varices without bleeding: Secondary | ICD-10-CM

## 2020-11-17 DIAGNOSIS — Z79899 Other long term (current) drug therapy: Secondary | ICD-10-CM | POA: Insufficient documentation

## 2020-11-17 DIAGNOSIS — K2980 Duodenitis without bleeding: Secondary | ICD-10-CM | POA: Diagnosis not present

## 2020-11-17 DIAGNOSIS — K921 Melena: Secondary | ICD-10-CM

## 2020-11-17 DIAGNOSIS — K2100 Gastro-esophageal reflux disease with esophagitis, without bleeding: Secondary | ICD-10-CM | POA: Insufficient documentation

## 2020-11-17 DIAGNOSIS — F17200 Nicotine dependence, unspecified, uncomplicated: Secondary | ICD-10-CM | POA: Diagnosis not present

## 2020-11-17 HISTORY — PX: ESOPHAGOGASTRODUODENOSCOPY (EGD) WITH PROPOFOL: SHX5813

## 2020-11-17 HISTORY — PX: ESOPHAGEAL BANDING: SHX5518

## 2020-11-17 LAB — POCT I-STAT EG7
Acid-base deficit: 2 mmol/L (ref 0.0–2.0)
Bicarbonate: 22.5 mmol/L (ref 20.0–28.0)
Calcium, Ion: 1.24 mmol/L (ref 1.15–1.40)
HCT: 36 % — ABNORMAL LOW (ref 39.0–52.0)
Hemoglobin: 12.2 g/dL — ABNORMAL LOW (ref 13.0–17.0)
O2 Saturation: 60 %
Potassium: 3.8 mmol/L (ref 3.5–5.1)
Sodium: 138 mmol/L (ref 135–145)
TCO2: 24 mmol/L (ref 22–32)
pCO2, Ven: 38.4 mmHg — ABNORMAL LOW (ref 44.0–60.0)
pH, Ven: 7.376 (ref 7.250–7.430)
pO2, Ven: 32 mmHg (ref 32.0–45.0)

## 2020-11-17 SURGERY — ESOPHAGOGASTRODUODENOSCOPY (EGD) WITH PROPOFOL
Anesthesia: Monitor Anesthesia Care

## 2020-11-17 MED ORDER — LACTATED RINGERS IV SOLN: INTRAVENOUS | Status: DC

## 2020-11-17 MED ORDER — MIDAZOLAM HCL 2 MG/2ML IJ SOLN
INTRAMUSCULAR | Status: AC
  Filled 2020-11-17: qty 2

## 2020-11-17 MED ORDER — PROPOFOL 10 MG/ML IV BOLUS
INTRAVENOUS | Status: AC
  Filled 2020-11-17: qty 20

## 2020-11-17 MED ORDER — LIDOCAINE 2% (20 MG/ML) 5 ML SYRINGE
INTRAMUSCULAR | Status: DC | PRN
  Administered 2020-11-17: 50 mg via INTRAVENOUS

## 2020-11-17 MED ORDER — PROPOFOL 10 MG/ML IV BOLUS
INTRAVENOUS | Status: DC | PRN
  Administered 2020-11-17: 20 mg via INTRAVENOUS
  Administered 2020-11-17 (×3): 30 mg via INTRAVENOUS

## 2020-11-17 MED ORDER — MIDAZOLAM HCL 2 MG/2ML IJ SOLN
INTRAMUSCULAR | Status: DC | PRN
  Administered 2020-11-17: 2 mg via INTRAVENOUS

## 2020-11-17 MED ORDER — PROPOFOL 500 MG/50ML IV EMUL
INTRAVENOUS | Status: DC | PRN
  Administered 2020-11-17: 200 ug/kg/min via INTRAVENOUS

## 2020-11-17 MED ORDER — SODIUM CHLORIDE 0.9 % IV SOLN: INTRAVENOUS | Status: DC

## 2020-11-17 MED ORDER — PROPOFOL 1000 MG/100ML IV EMUL
INTRAVENOUS | Status: AC
  Filled 2020-11-17: qty 100

## 2020-11-17 MED ORDER — LACTATED RINGERS IV SOLN: INTRAVENOUS | Status: DC | PRN

## 2020-11-17 SURGICAL SUPPLY — 14 items

## 2020-11-17 NOTE — Transfer of Care (Signed)
Immediate Anesthesia Transfer of Care Note  Patient: Dennis [name]  Procedure(s) Performed: ESOPHAGOGASTRODUODENOSCOPY (EGD) WITH PROPOFOL ESOPHAGEAL BANDING  Patient Location: Endoscopy Unit  Anesthesia Type:MAC  Level of Consciousness: drowsy  Airway & Oxygen Therapy: Patient Spontanous Breathing and Patient connected to face mask  Post-op Assessment: Report given to RN and Post -op Vital signs reviewed and stable  Post vital signs: Reviewed and stable  Last Vitals:  Vitals Value Taken Time  BP 131/77 11/17/20 0928  Temp    Pulse 76 11/17/20 0930  Resp 24 11/17/20 0930  SpO2 100 % 11/17/20 0930  Vitals shown include unvalidated device data.  Last Pain:  Vitals:   11/17/20 0825  TempSrc: Oral  PainSc: 5       Patients Stated Pain Goal: 0 (07/37/10 6269)  Complications: No notable events documented.

## 2020-11-17 NOTE — Anesthesia Postprocedure Evaluation (Signed)
Anesthesia Post Note  Patient: Dennis [name]  Procedure(s) Performed: ESOPHAGOGASTRODUODENOSCOPY (EGD) WITH PROPOFOL ESOPHAGEAL BANDING     Patient location during evaluation: PACU Anesthesia Type: MAC Level of consciousness: awake and alert Pain management: pain level controlled Vital Signs Assessment: post-procedure vital signs reviewed and stable Respiratory status: spontaneous breathing, nonlabored ventilation, respiratory function stable and patient connected to nasal cannula oxygen Cardiovascular status: stable and blood pressure returned to baseline Postop Assessment: no apparent nausea or vomiting Anesthetic complications: no   No notable events documented.  Last Vitals:  Vitals:   11/17/20 0940 11/17/20 0950  BP: 133/83 (!) 152/93  Pulse: 80 83  Resp: 20 (!) 21  Temp:    SpO2: 100% 100%    Last Pain:  Vitals:   11/17/20 0950  TempSrc:   PainSc: 0-No pain                 Niyah Mamaril

## 2020-11-17 NOTE — Telephone Encounter (Signed)
Left message for patient to call office.  

## 2020-11-17 NOTE — Discharge Instructions (Signed)
YOU HAD AN ENDOSCOPIC PROCEDURE TODAY: Refer to the procedure report and other information in the discharge instructions given to you for any specific questions about what was found during the examination. If this information does not answer your questions, please call [place] office at [phone] to clarify.   YOU SHOULD EXPECT: Some feelings of bloating in the abdomen. Passage of more gas than usual. Walking can help get rid of the air that was put into your GI tract during the procedure and reduce the bloating. If you had a lower endoscopy (such as a colonoscopy or flexible sigmoidoscopy) you may notice spotting of blood in your stool or on the toilet paper. Some abdominal soreness may be present for a day or two, also.  DIET: Your first meal following the procedure should be a light meal and then it is ok to progress to your normal diet. A half-sandwich or bowl of soup is an example of a good first meal. Heavy or fried foods are harder to digest and may make you feel nauseous or bloated. Drink plenty of fluids but you should avoid alcoholic beverages for 24 hours. If you had a esophageal dilation, please see attached instructions for diet.    ACTIVITY: Your care partner should take you home directly after the procedure. You should plan to take it easy, moving slowly for the rest of the day. You can resume normal activity the day after the procedure however YOU SHOULD NOT DRIVE, use power tools, machinery or perform tasks that involve climbing or major physical exertion for 24 hours (because of the sedation medicines used during the test).   SYMPTOMS TO REPORT IMMEDIATELY: A gastroenterologist can be reached at any hour. Please call [phone]  for any of the following symptoms:   Following upper endoscopy (EGD, EUS, ERCP, esophageal dilation) Vomiting of blood or coffee ground material  New, significant abdominal pain  New, significant chest pain or pain under the shoulder blades  Painful or  persistently difficult swallowing  New shortness of breath  Black, tarry-looking or red, bloody stools  FOLLOW UP:  If any biopsies were taken you will be contacted by phone or by letter within the next 1-3 weeks. Call [phone]  if you have not heard about the biopsies in 3 weeks.  Please also call with any specific questions about appointments or follow up tests.  

## 2020-11-17 NOTE — Op Note (Signed)
[facility] Patient Name: Dennis [name] Procedure Date: 11/17/2020 MRN: [id number] Attending MD: Meryl Dare , MD Date of Birth: December 29, 1990 CSN: [id number] Age: 30 Admit Type: Outpatient Procedure:                Upper GI endoscopy Indications:              Follow-up of esophageal varices Providers:                Venita Lick. Russella Dar, MD, Marge Duncans, RN, Rozetta Nunnery, Technician Referring MD:              Medicines:                Monitored Anesthesia Care Complications:            No immediate complications. Estimated Blood Loss:     Estimated blood loss was minimal. Procedure:                Pre-Anesthesia Assessment:                           - Prior to the procedure, a History and Physical                            was performed, and patient medications and                            allergies were reviewed. The patient's tolerance of                            previous anesthesia was also reviewed. The risks                            and benefits of the procedure and the sedation                            options and risks were discussed with the patient.                            All questions were answered, and informed consent                            was obtained. Prior Anticoagulants: The patient has                            taken no previous anticoagulant or antiplatelet                            agents. ASA Grade Assessment: II - A patient with                            mild systemic disease. After reviewing the risks  and benefits, the patient was deemed in                            satisfactory condition to undergo the procedure.                           After obtaining informed consent, the endoscope was                            passed under direct vision. Throughout the                            procedure, the patient's blood pressure, pulse, and                             oxygen saturations were monitored continuously. The                            [id number]-[id number] ([id number]) was introduced through the                            mouth, and advanced to the second part of duodenum.                            The upper GI endoscopy was accomplished without                            difficulty. The patient tolerated the procedure                            well. Scope In: Scope Out: Findings:      Grade I varices were found in the distal esophagus. They were 4 mm in       largest diameter. One band was successfully placed with complete       eradication, resulting in deflation of varices. There was no bleeding       during the procedure.      LA Grade B (one or more mucosal breaks greater than 5 mm, not extending       between the tops of two mucosal folds) esophagitis with no bleeding was       found at the gastroesophageal junction.      The exam of the esophagus was otherwise normal.      Extrinsic compression on the stomach was found in the gastric fundus and       on the posterior wall of the gastric body.      The exam of the stomach was otherwise normal.      Localized mildly erythematous mucosa without active bleeding and with no       stigmata of bleeding was found in the duodenal bulb.      The exam of the duodenum was otherwise normal. Impression:               - Grade I esophageal varices. Completely                            eradicated. Banded.                           -  LA Grade B reflux esophagitis with no bleeding.                           - Extrinsic compression in the gastric fundus and                            in the gastric body (posterior wall).                           - Erythematous duodenopathy.                           - No specimens collected. Moderate Sedation:      Not Applicable - Patient had care per Anesthesia. Recommendation:           - Patient has a contact number available for                            emergencies. The  signs and symptoms of potential                            delayed complications were discussed with the                            patient. Return to normal activities tomorrow.                            Written discharge instructions were provided to the                            patient.                           - Resume previous diet.                           - Follow antireflux measures.                           - Continue present medications including                            pantoprazole 40 mg po qd.                           - Outpatient GI follow up in 3 months Procedure Code(s):        --- Professional ---                           [id number], Esophagogastroduodenoscopy, flexible,                            transoral; with band ligation of esophageal/gastric                            varices Diagnosis Code(s):        ---  Professional ---                           I85.00, Esophageal varices without bleeding                           K21.00, Gastro-esophageal reflux disease with                            esophagitis, without bleeding                           K31.89, Other diseases of stomach and duodenum CPT copyright 2019 [employer] Association. All rights reserved. The codes documented in this report are preliminary and upon coder review may  be revised to meet current compliance requirements. Meryl Dare, MD 11/17/2020 9:32:36 AM This report has been signed electronically. Number of Addenda: 0

## 2020-11-17 NOTE — Anesthesia Preprocedure Evaluation (Addendum)
Anesthesia Evaluation  Patient identified by MRN, date of birth, ID band Patient awake    Reviewed: Allergy & Precautions, NPO status , Patient's Chart, lab work & pertinent test results, reviewed documented beta blocker date and time   Airway Mallampati: II  TM Distance: >3 FB Neck ROM: Full    Dental  (+) Poor Dentition, Missing, Chipped, Dental Advisory Given,    Pulmonary Current Smoker and Patient abstained from smoking.,    Pulmonary exam normal breath sounds clear to auscultation       Cardiovascular hypertension, negative cardio ROS Normal cardiovascular exam Rhythm:Regular Rate:Normal     Neuro/Psych negative neurological ROS  negative psych ROS   GI/Hepatic Neg liver ROS, Acute pancreatitis Melena Abnormal CT scan abdomen   Endo/Other  negative endocrine ROS  Renal/GU Renal Insufficiency and ARFRenal disease  negative genitourinary   Musculoskeletal negative musculoskeletal ROS (+)   Abdominal   Peds  Hematology  (+) Blood dyscrasia, anemia ,   Anesthesia Other Findings   Reproductive/Obstetrics                            Anesthesia Physical  Anesthesia Plan  ASA: 3  Anesthesia Plan: MAC   Post-op Pain Management:    Induction: Intravenous  PONV Risk Score and Plan: 2 and Propofol infusion and Treatment may vary due to age or medical condition  Airway Management Planned: Natural Airway and Nasal Cannula  Additional Equipment:   Intra-op Plan:   Post-operative Plan:   Informed Consent: I have reviewed the patients History and Physical, chart, labs and discussed the procedure including the risks, benefits and alternatives for the proposed anesthesia with the patient or authorized representative who has indicated his/her understanding and acceptance.     Dental advisory given  Plan Discussed with: Anesthesiologist and CRNA  Anesthesia Plan Comments:         Anesthesia Quick Evaluation

## 2020-11-17 NOTE — Interval H&P Note (Signed)
History and Physical Interval Note:  11/17/2020 9:00 AM  Dennis [name]  has presented today for surgery, with the diagnosis of esophageal varices.  The various methods of treatment have been discussed with the patient and family. After consideration of risks, benefits and other options for treatment, the patient has consented to  Procedure(s): ESOPHAGOGASTRODUODENOSCOPY (EGD) WITH PROPOFOL (N/A) ESOPHAGEAL BANDING (N/A) as a surgical intervention.  The patient's history has been reviewed, patient examined, no change in status, stable for surgery.  I have reviewed the patient's chart and labs.  Questions were answered to the patient's satisfaction.     Venita Lick. Russella Dar

## 2020-11-19 ENCOUNTER — Encounter (HOSPITAL_COMMUNITY): Payer: Self-pay | Admitting: Gastroenterology

## 2020-11-28 ENCOUNTER — Other Ambulatory Visit: Payer: Self-pay

## 2020-11-28 NOTE — Telephone Encounter (Signed)
Mailed letter to patient to call office.

## 2020-12-02 ENCOUNTER — Ambulatory Visit: Payer: Self-pay | Admitting: Gastroenterology

## 2022-05-11 IMAGING — DX DG CHEST 1V PORT
1 series · 1 of 1 positions shown · non-contrast
Comparison: No prior.

CLINICAL DATA: Kidney.

EXAM:
PORTABLE CHEST 1 VIEW

[chest]
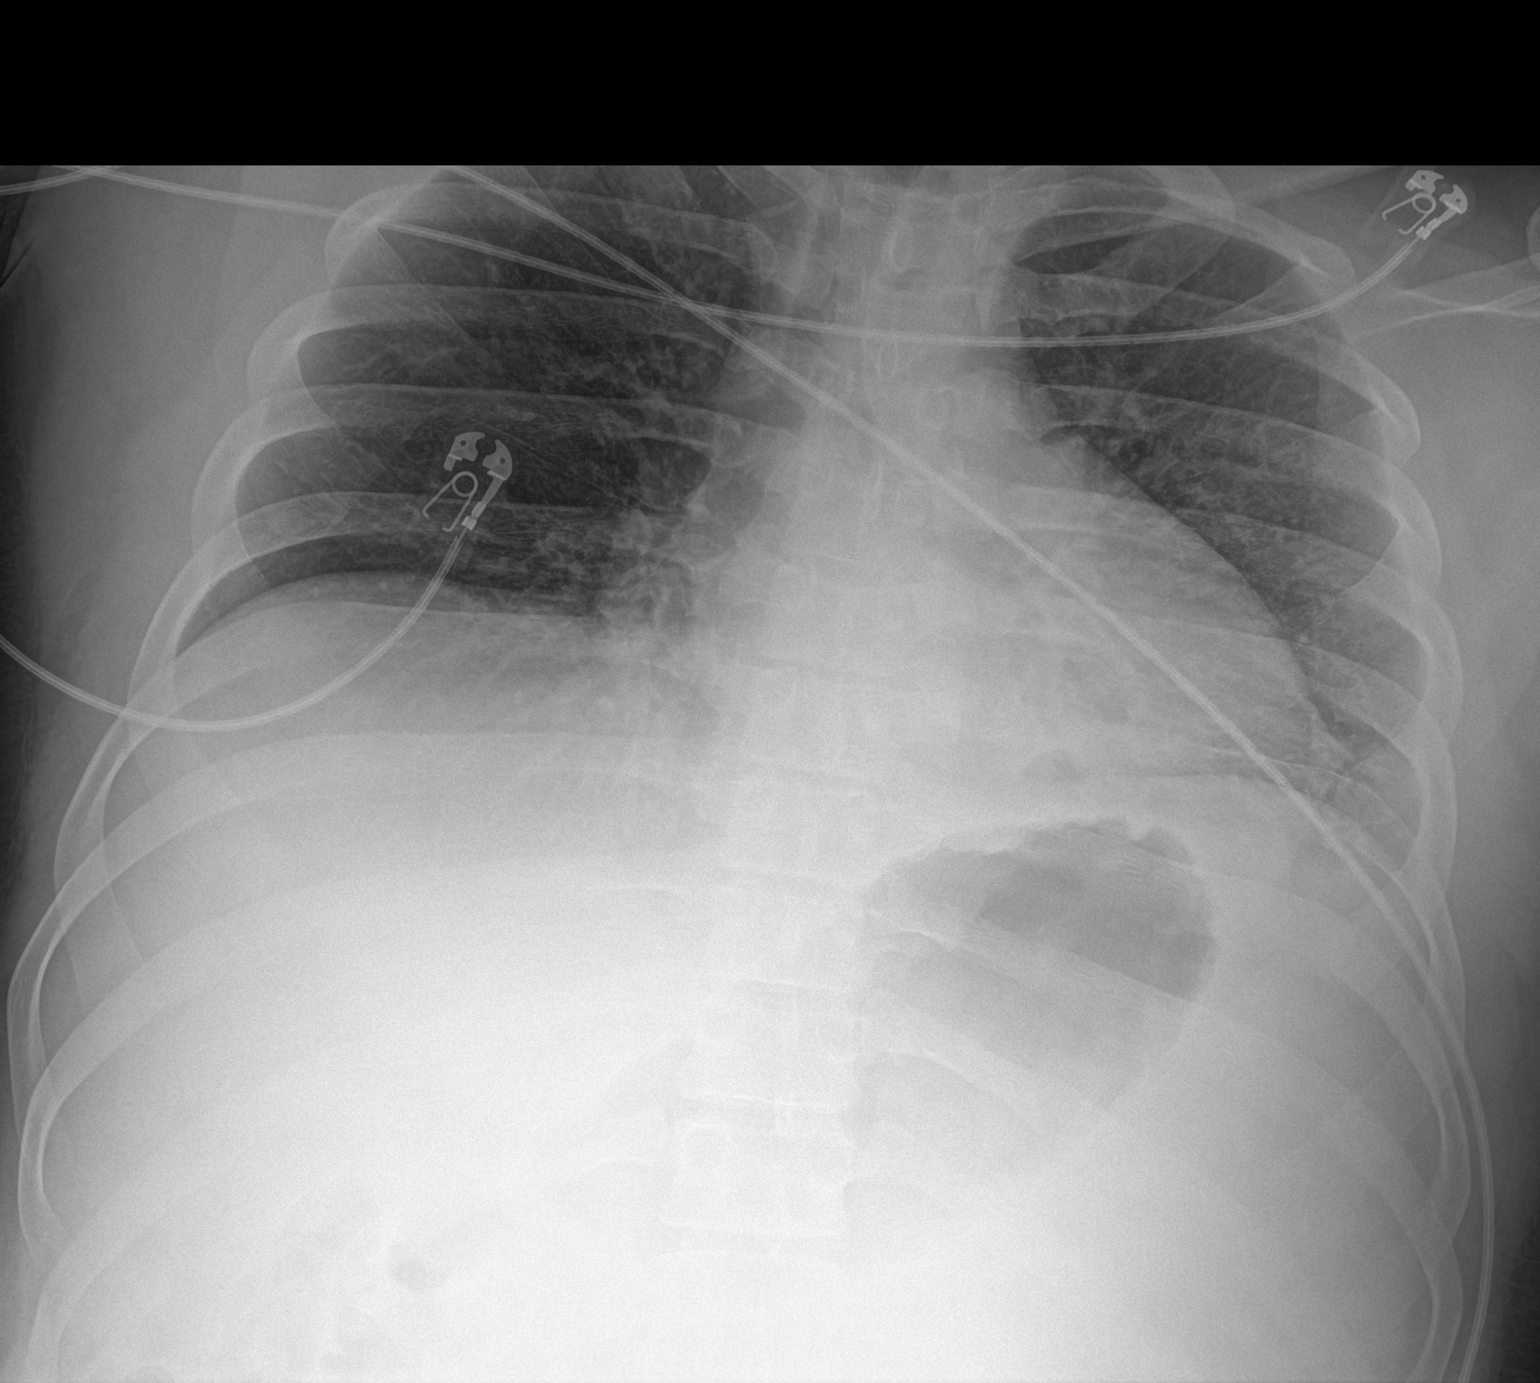

[1 of 1 positions shown; findings below may reference images not displayed]

FINDINGS: Heart size normal. Low lung volumes with bibasilar atelectasis. Mild
bibasilar infiltrates cannot be excluded. Tiny left pleural effusion
cannot excluded. No pneumothorax.
IMPRESSION: Low lung volumes with bibasilar atelectasis. Mild bibasilar
infiltrates cannot be excluded. Tiny left pleural effusion cannot be
excluded.

## 2022-05-17 IMAGING — DX DG CHEST 1V PORT
1 series · 1 of 1 positions shown · non-contrast
Comparison: 09/30/2020.

CLINICAL DATA: Leukocytosis.  Short of breath on exertion.

EXAM:
PORTABLE CHEST 1 VIEW

[chest ap]
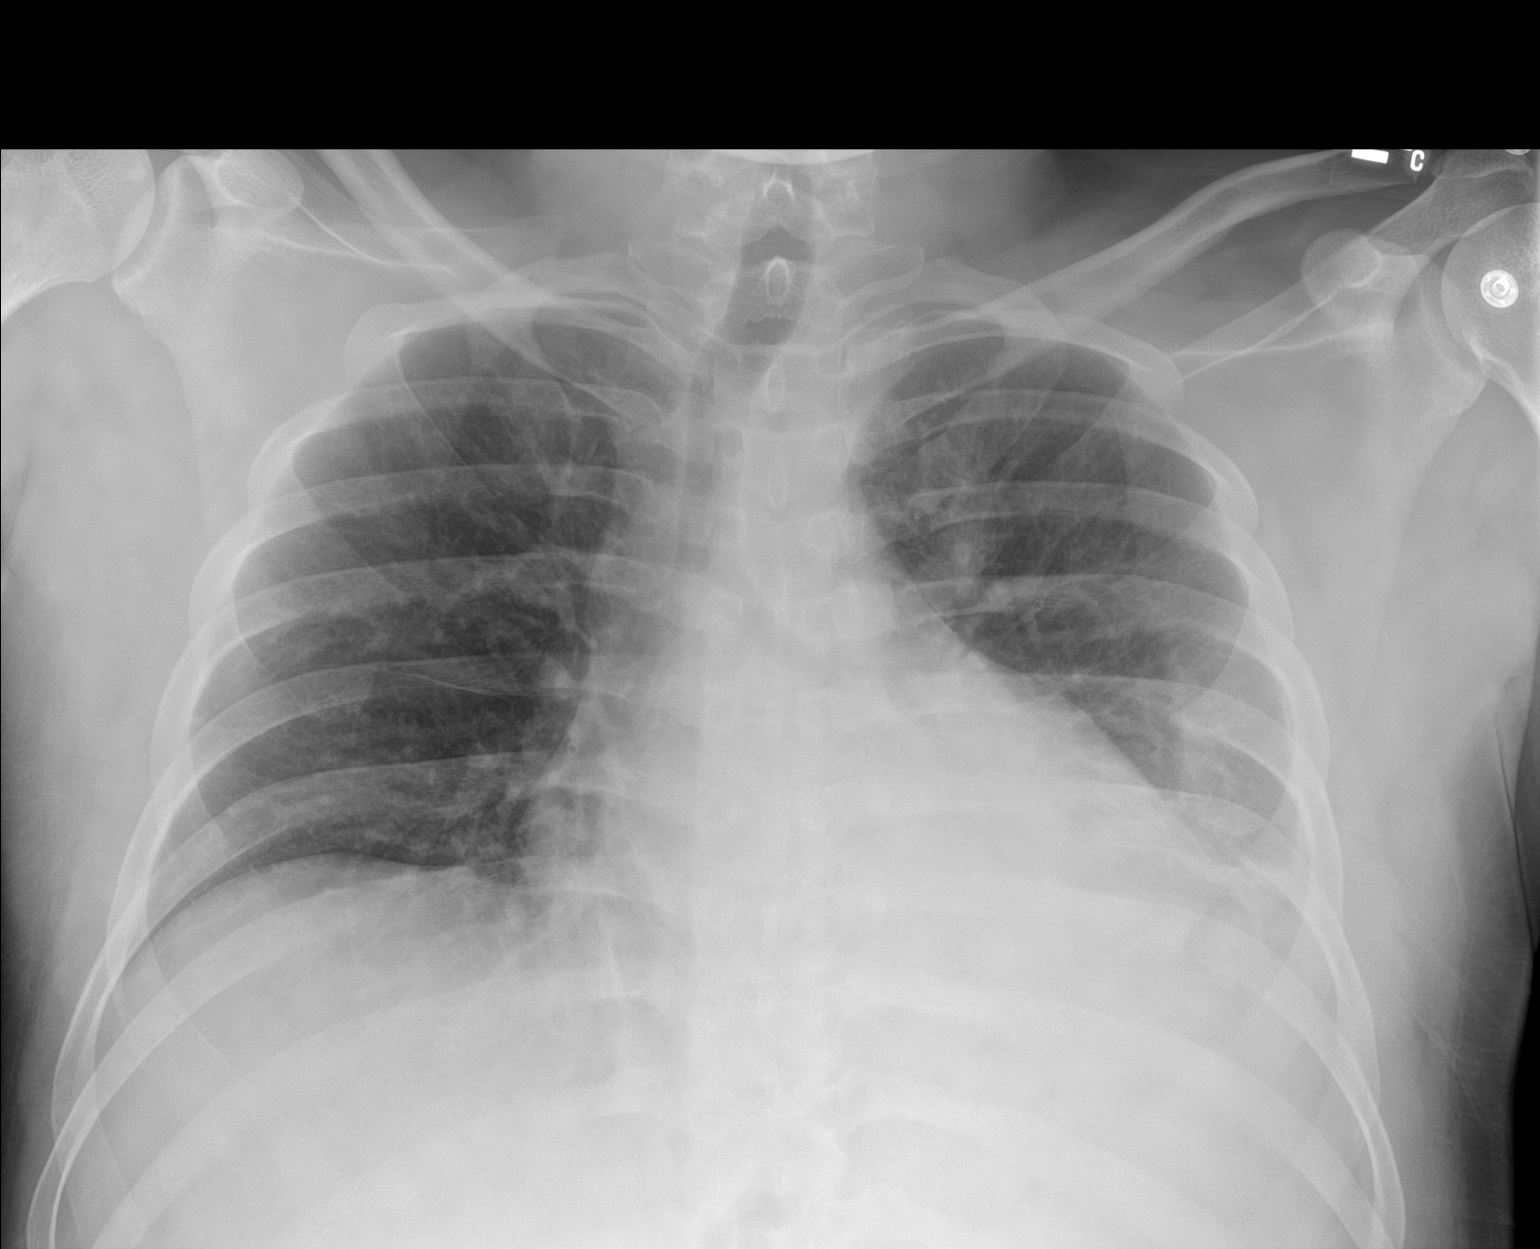

[1 of 1 positions shown; findings below may reference images not displayed]

FINDINGS: There is consolidation at the left lung base obscuring
hemidiaphragm. Mild hazy opacity extends to the left peripheral mid
lung. Remainder of the lungs is clear.

No right pleural effusion.  No pneumothorax.

Heart, mediastinum hila are unremarkable.

Skeletal structures are grossly intact.
IMPRESSION: 1. Left lower lobe consolidation consistent with pneumonia. Possible
associated small effusion.

## 2022-05-21 IMAGING — CR DG CHEST 1V
1 series · 1 of 1 positions shown · non-contrast
Comparison: Chest x-ray 10/06/2020.  Chest CT 10/07/2020.

CLINICAL DATA: Post thoracentesis

EXAM:
CHEST  1 VIEW

[chest ap]
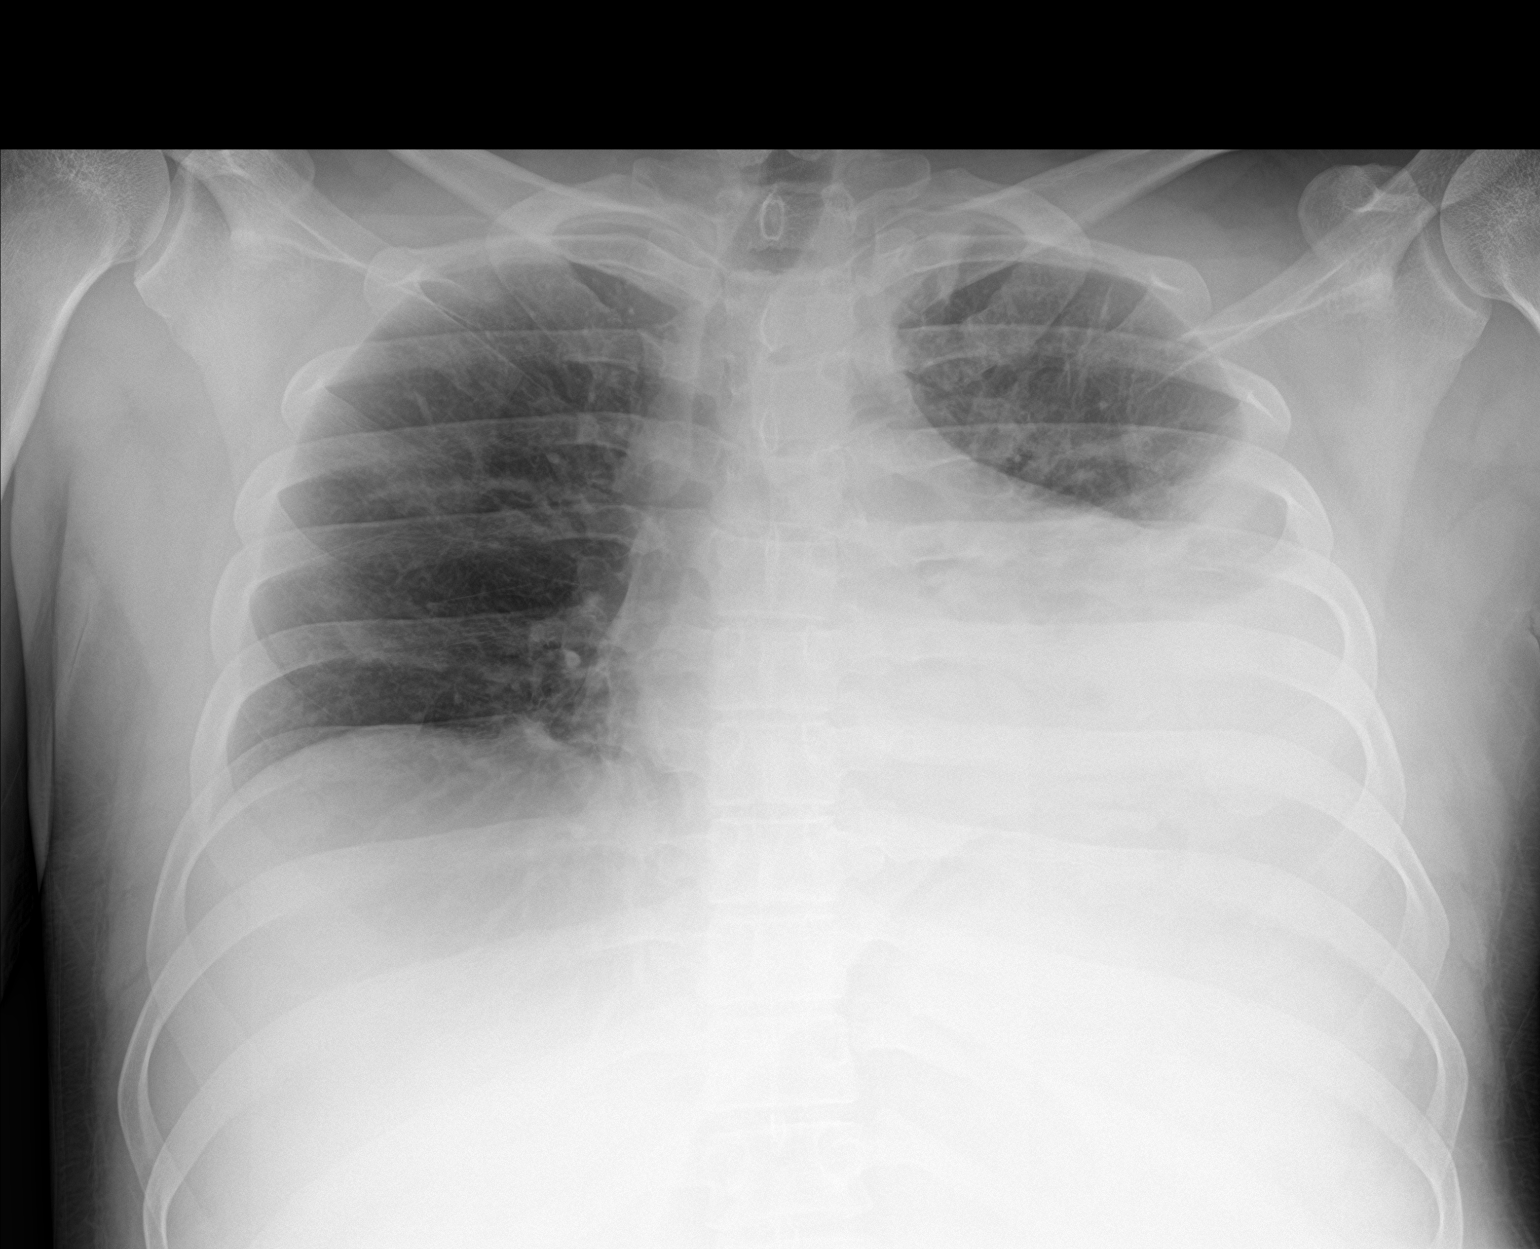

[1 of 1 positions shown; findings below may reference images not displayed]

FINDINGS: Worsening moderate left pleural effusion and airspace disease in the
left lower lobe. Very low lung volumes. No pneumothorax following
thoracentesis. Heart size is accentuated by the low volumes.
IMPRESSION: Worsening aeration/lung volumes with increasing left effusion and
left lower lobe airspace disease. No pneumothorax.

## 2022-05-21 IMAGING — US IR THORACENTESIS ASP PLEURAL SPACE W/IMG GUIDE
1 series · 2 of 2 positions shown · non-contrast
Comparison: none

INDICATION: History of pancreatitis found to have pneumonia with left-sided
pleural effusion. Request is for therapeutic and diagnostic
thoracentesis

[Series 1: ir (id) (id)/(id)/(id) ir · 2 of 2 slices shown]
[im 1/2]
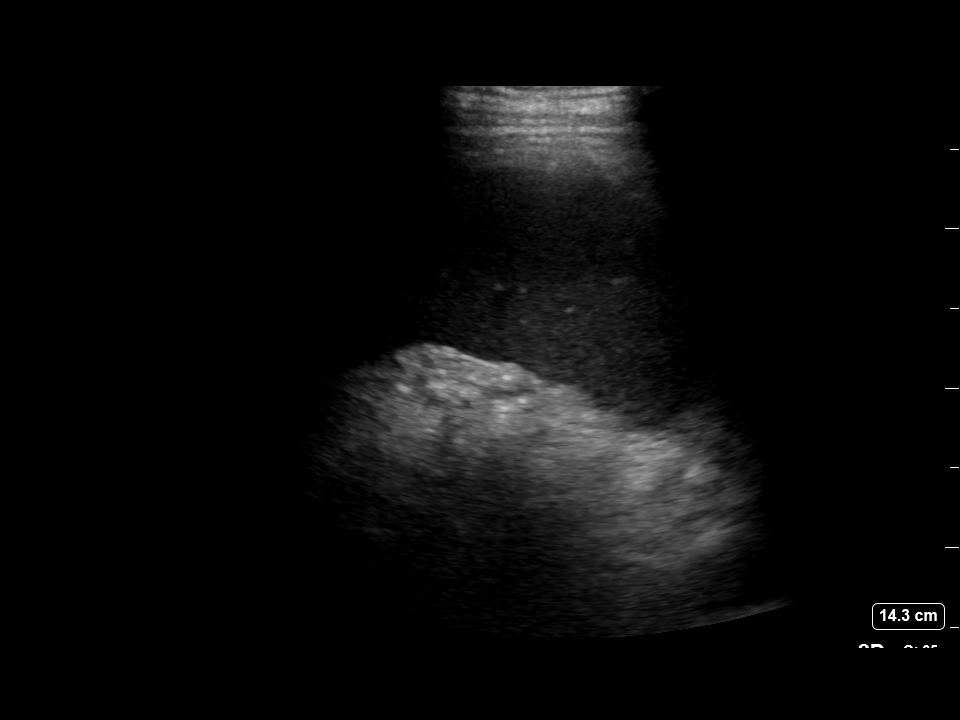
[im 2/2]
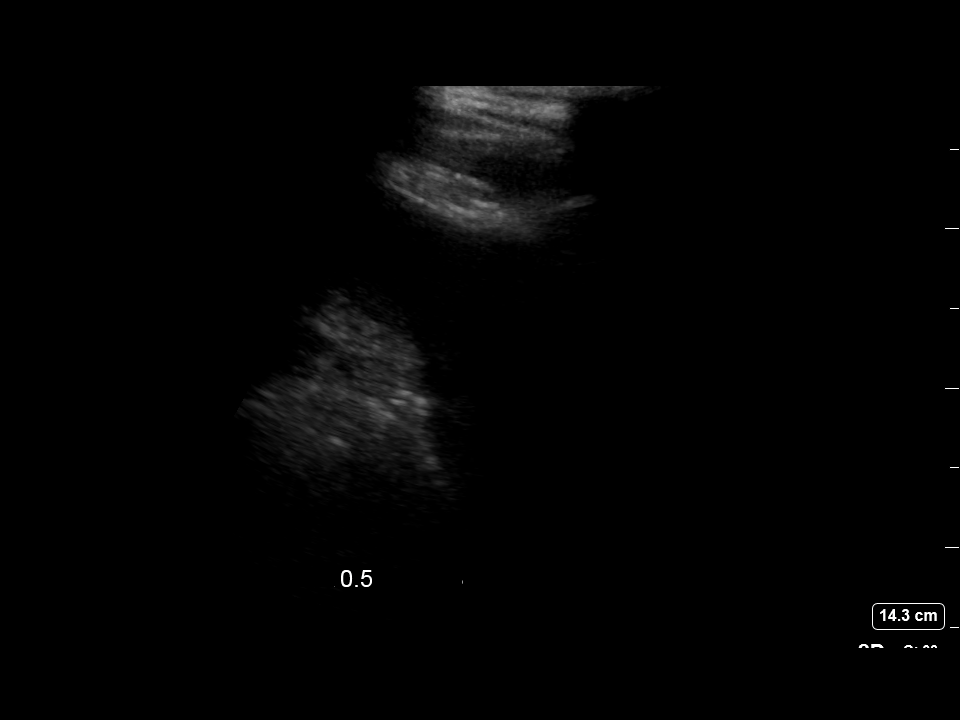

[2 of 2 positions shown; findings below may reference images not displayed]

EXAM:
ULTRASOUND GUIDED THERAPEUTIC AND DIAGNOSTIC LEFT-SIDED
THORACENTESIS

MEDICATIONS:
Lidocaine 1% 10 mL

COMPLICATIONS:
None immediate.

PROCEDURE:
An ultrasound guided thoracentesis was thoroughly discussed with the
patient and questions answered. The benefits, risks, alternatives
and complications were also discussed. The patient understands and
wishes to proceed with the procedure. Written consent was obtained.

Ultrasound was performed to localize and mark an adequate pocket of
fluid in the left chest. The area was then prepped and draped in the
normal sterile fashion. 1% Lidocaine was used for local anesthesia.
Under ultrasound guidance a 6 Fr Safe-T-Centesis catheter was
introduced. Thoracentesis was performed. The catheter was removed
and a dressing applied. Patient unable to tolerate additional fluid
removal at this time.
FINDINGS: A total of approximately 500 mL of amber colored fluid was removed.
Samples were sent to the laboratory as requested by the clinical
team.
IMPRESSION: Successful ultrasound guided therapeutic and diagnostic left-sided
thoracentesis yielding 500 mL of pleural fluid.

Read by: Francieuda Huller, NP

## 2024-01-05 ENCOUNTER — Other Ambulatory Visit (HOSPITAL_COMMUNITY): Payer: Self-pay

## 2024-01-05 MED ORDER — FENOFIBRATE 145 MG PO TABS
145.0000 mg | ORAL_TABLET | Freq: Every day | ORAL | 0 refills | Status: DC
  Filled 2024-01-05 (×2): qty 30, 30d supply, fill #0

## 2024-01-05 MED ORDER — ATORVASTATIN CALCIUM 80 MG PO TABS
80.0000 mg | ORAL_TABLET | Freq: Every day | ORAL | 0 refills | Status: DC
  Filled 2024-01-05 (×2): qty 30, 30d supply, fill #0

## 2024-01-13 ENCOUNTER — Other Ambulatory Visit: Payer: Self-pay

## 2024-01-13 ENCOUNTER — Other Ambulatory Visit (HOSPITAL_COMMUNITY): Payer: Self-pay

## 2024-01-13 MED ORDER — CREON 24000-76000 UNITS PO CPEP
ORAL_CAPSULE | ORAL | 2 refills | Status: DC
  Filled 2024-01-13: qty 100, 33d supply, fill #0
  Filled 2024-02-20: qty 100, 33d supply, fill #1
  Filled 2024-03-28: qty 100, 33d supply, fill #2
  Filled 2024-05-15: qty 100, 33d supply, fill #3

## 2024-01-14 ENCOUNTER — Emergency Department (HOSPITAL_COMMUNITY)
Admission: EM | Admit: 2024-01-14 | Discharge: 2024-01-14 | Disposition: A | Discharge status: Home / Self Care | Attending: Emergency Medicine | Admitting: Emergency Medicine

## 2024-01-14 ENCOUNTER — Emergency Department (HOSPITAL_COMMUNITY)

## 2024-01-14 ENCOUNTER — Encounter (HOSPITAL_COMMUNITY): Payer: Self-pay | Admitting: Emergency Medicine

## 2024-01-14 ENCOUNTER — Other Ambulatory Visit: Payer: Self-pay

## 2024-01-14 DIAGNOSIS — R1031 Right lower quadrant pain: Secondary | ICD-10-CM | POA: Diagnosis present

## 2024-01-14 DIAGNOSIS — R110 Nausea: Secondary | ICD-10-CM | POA: Insufficient documentation

## 2024-01-14 DIAGNOSIS — R109 Unspecified abdominal pain: Secondary | ICD-10-CM

## 2024-01-14 LAB — URINALYSIS, ROUTINE W REFLEX MICROSCOPIC
Bacteria, UA: NONE SEEN
Bilirubin Urine: NEGATIVE
Glucose, UA: NEGATIVE mg/dL
Ketones, ur: NEGATIVE mg/dL
Leukocytes,Ua: NEGATIVE
Nitrite: NEGATIVE
Protein, ur: NEGATIVE mg/dL
Specific Gravity, Urine: 1.025 (ref 1.005–1.030)
pH: 5 (ref 5.0–8.0)

## 2024-01-14 LAB — COMPREHENSIVE METABOLIC PANEL WITH GFR
ALT: 16 U/L (ref 0–44)
AST: 19 U/L (ref 15–41)
Albumin: 4.8 g/dL (ref 3.5–5.0)
Alkaline Phosphatase: 53 U/L (ref 38–126)
Anion gap: 13 (ref 5–15)
BUN: 16 mg/dL (ref 6–20)
CO2: 23 mmol/L (ref 22–32)
Calcium: 9.9 mg/dL (ref 8.9–10.3)
Chloride: 105 mmol/L (ref 98–111)
Creatinine, Ser: 1.2 mg/dL (ref 0.61–1.24)
GFR, Estimated: >60 mL/min (ref >60)
Glucose, Bld: 109 mg/dL — ABNORMAL HIGH (ref 70–99)
Potassium: 3.8 mmol/L (ref 3.5–5.1)
Sodium: 141 mmol/L (ref 135–145)
Total Bilirubin: 1.1 mg/dL (ref 0.0–1.2)
Total Protein: 8.6 g/dL — ABNORMAL HIGH (ref 6.5–8.1)

## 2024-01-14 LAB — CBC
HCT: 45.3 % (ref 39.0–52.0)
Hemoglobin: 14.6 g/dL (ref 13.0–17.0)
MCH: 29.9 pg (ref 26.0–34.0)
MCHC: 32.2 g/dL (ref 30.0–36.0)
MCV: 92.8 fL (ref 80.0–100.0)
Platelets: 214 K/uL (ref 150–400)
RBC: 4.88 MIL/uL (ref 4.22–5.81)
RDW: 13.6 % (ref 11.5–15.5)
WBC: 9.6 K/uL (ref 4.0–10.5)
nRBC: 0 % (ref 0.0–0.2)

## 2024-01-14 LAB — LIPASE, BLOOD: Lipase: 31 U/L (ref 11–51)

## 2024-01-14 MED ORDER — SODIUM CHLORIDE 0.9 % IV SOLN
12.5000 mg | Freq: Once | INTRAVENOUS | Status: AC
  Administered 2024-01-14: 12.5 mg via INTRAVENOUS
  Filled 2024-01-14: qty 12.5

## 2024-01-14 MED ORDER — HYDROMORPHONE HCL 1 MG/ML IJ SOLN
1.0000 mg | Freq: Once | INTRAMUSCULAR | Status: AC
  Administered 2024-01-14: 1 mg via INTRAVENOUS
  Filled 2024-01-14: qty 1

## 2024-01-14 MED ORDER — SODIUM CHLORIDE 0.9 % IV BOLUS
1000.0000 mL | Freq: Once | INTRAVENOUS | Status: AC
  Administered 2024-01-14: 1000 mL via INTRAVENOUS

## 2024-01-14 MED ORDER — IOHEXOL 300 MG/ML  SOLN
100.0000 mL | Freq: Once | INTRAMUSCULAR | Status: AC | PRN
  Administered 2024-01-14: 100 mL via INTRAVENOUS

## 2024-01-14 MED ORDER — PROMETHAZINE HCL 25 MG PO TABS
25.0000 mg | ORAL_TABLET | Freq: Four times a day (QID) | ORAL | 0 refills | Status: DC | PRN
  Filled 2024-01-14: qty 30, 8d supply, fill #0

## 2024-01-14 NOTE — ED Notes (Signed)
 Patient transported to CT

## 2024-01-14 NOTE — ED Provider Notes (Signed)
 Dennis [name]   CSN: [id number] Arrival date & time: 01/14/24  8091     Patient presents with: Abdominal Pain   Dennis [name] is a 33 y.o. male.   Patient here with abdominal pain.  History of pancreatitis.  Has had some nausea.  Now has chronic pancreatitis and pseudocyst.  Feels similar to prior.  Denies any further alcohol use.  Denies any chest pain shortness of breath weakness numbness tingling.  Of the makes it worse or better.  The history is provided by the patient.       Prior to Admission medications   Medication Sig Start Date End Date Taking? Authorizing Provider  promethazine  (PHENERGAN ) 25 MG tablet Take 1 tablet (25 mg total) by mouth every 6 (six) hours as needed for nausea or vomiting. 01/14/24  Yes Shabreka Coulon, DO  amLODipine  (NORVASC ) 10 MG tablet Take 1 tablet (10 mg total) by mouth daily. Patient taking differently: Take 10 mg by mouth every evening. 10/14/20 01/12/21  Raenelle Coria, MD  atorvastatin  (LIPITOR) 80 MG tablet Take 1 tablet (80 mg total) by mouth at bedtime. 01/05/24     fenofibrate  (TRICOR ) 145 MG tablet Take 1 tablet (145 mg total) by mouth daily. 01/05/24     fenofibrate  54 MG tablet Take 1 tablet (54 mg total) by mouth daily. Patient taking differently: Take 54 mg by mouth in the morning. 10/14/20 01/12/21  Ghimire, Kuber, MD  ibuprofen (ADVIL) 200 MG tablet Take 400 mg by mouth every 8 (eight) hours as needed (for pain/headaches.).    [provider]  Pancrelipase , Lip-Prot-Amyl, (CREON ) 24000-76000 units CPEP Take 1 capsule by mouth in the morning and 1 capsule at noon and 1 capsule in the evening. Take with meals. 01/13/24     pantoprazole  (PROTONIX ) 40 MG tablet Take 1 tablet (40 mg total) by mouth 2 (two) times daily. 10/14/20 01/12/21  Raenelle Coria, MD    Allergies: Patient has no known allergies.    Review of Systems  Updated Vital Signs BP (!) 167/97 (BP  Location: Left Arm)   Pulse 84   Temp 98.4 F (36.9 C) (Oral)   Resp 16   Ht 6' (1.829 m)   Wt 91.6 kg   SpO2 100%   BMI 27.40 kg/m   Physical Exam Vitals and nursing [name] reviewed.  Constitutional:      General: He is not in acute distress.    Appearance: He is well-developed.  HENT:     Head: Normocephalic and atraumatic.     Mouth/Throat:     Mouth: Mucous membranes are moist.  Eyes:     Extraocular Movements: Extraocular movements intact.     Conjunctiva/sclera: Conjunctivae normal.     Pupils: Pupils are equal, round, and reactive to light.  Cardiovascular:     Rate and Rhythm: Normal rate and regular rhythm.     Heart sounds: Normal heart sounds. No murmur heard. Pulmonary:     Effort: Pulmonary effort is normal. No respiratory distress.     Breath sounds: Normal breath sounds.  Abdominal:     Palpations: Abdomen is soft.     Tenderness: There is abdominal tenderness.  Musculoskeletal:        General: No swelling.     Cervical back: Neck supple.  Skin:    General: Skin is warm and dry.     Capillary Refill: Capillary refill takes less than 2 seconds.  Neurological:  Mental Status: He is alert.  Psychiatric:        Mood and Affect: Mood normal.     (all labs ordered are listed, but only abnormal results are displayed) Labs Reviewed  COMPREHENSIVE METABOLIC PANEL WITH GFR - Abnormal; Notable for the following components:      Result Value   Glucose, Bld 109 (*)    Total Protein 8.6 (*)    All other components within normal limits  URINALYSIS, ROUTINE W REFLEX MICROSCOPIC - Abnormal; Notable for the following components:   Hgb urine dipstick SMALL (*)    All other components within normal limits  LIPASE, BLOOD  CBC    EKG: None  Radiology: CT ABDOMEN PELVIS W CONTRAST Result Date: 01/14/2024 CLINICAL DATA:  Abdominal pain EXAM: CT ABDOMEN AND PELVIS WITH CONTRAST TECHNIQUE: Multidetector CT imaging of the abdomen and pelvis was performed using  the standard protocol following bolus administration of intravenous contrast. RADIATION DOSE REDUCTION: This exam was performed according to the departmental dose-optimization program which includes automated exposure control, adjustment of the mA and/or kV according to patient size and/or use of iterative reconstruction technique. CONTRAST:  OMNIPAQUE  IOHEXOL  300 MG/ML  SOLN COMPARISON:  10/22/2020 FINDINGS: Lower chest: Linear scarring or atelectasis of the lung base. No effusions. Hepatobiliary: No focal hepatic abnormality. Gallbladder unremarkable. Pancreas: In the area of the pancreatic tail, there is a 6.3 x 6.0 cm low-density mass noted which abuts the greater curvature of the stomach. This is in the area of previously seen large cystic mass on prior CT and presumably reflects residual pseudo cyst although this is not definitively completely cystic on today's study. No evidence of acute pancreatitis. Spleen: No focal abnormality.  Normal size. Adrenals/Urinary Tract: No adrenal abnormality. No focal renal abnormality. No stones or hydronephrosis. Urinary bladder is unremarkable. Stomach/Bowel: Normal appendix. Few scattered colonic diverticula. No active diverticulitis. No bowel obstruction or inflammatory process. 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 No evidence of aneurysm or adenopathy. Reproductive: No visible focal abnormality. Other: No free fluid or free air. Musculoskeletal: No acute bony abnormality. IMPRESSION: 6.3 x 6.0 cm low-density mass in the region of the pancreatic tail abutting the greater curvature of the stomach. This is in the same area as previously seen large pseudo cyst and has decreased in size since prior study. This could be confirmed as a residual pseudo cyst with non emergent outpatient MRI. 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 Electronically Signed   By: Franky Crease M.D.   On: 01/14/2024 21:13   DG Chest Portable 1 View Result Date: 01/14/2024 CLINICAL DATA:  Right lower quadrant pain EXAM: PORTABLE CHEST 1 VIEW COMPARISON:  10/22/2020 FINDINGS: The heart size and mediastinal contours are within normal limits. Both lungs are clear. The visualized skeletal structures are unremarkable. IMPRESSION: No active disease. Electronically Signed   By: Franky Crease M.D.   On: 01/14/2024 20:14     Procedures   Medications Ordered in the ED  HYDROmorphone  (DILAUDID ) injection 1 mg (1 mg Intravenous Given 01/14/24 2052)  promethazine  (PHENERGAN ) 12.5 mg in sodium chloride  0.9 % 50 mL IVPB (0 mg Intravenous Stopped 01/14/24 2137)  sodium chloride  0.9 % bolus 1,000 mL (0 mLs Intravenous Stopped 01/14/24 2137)  iohexol  (OMNIPAQUE ) 300 MG/ML solution 100 mL (100 mLs Intravenous Contrast Given 01/14/24 2046)  Medical Decision Making Amount and/or Complexity of Data Reviewed Labs: ordered. Radiology: ordered.  Risk Prescription drug management.   Hagan Vanauken is here with abdominal pain.  History of pancreatitis.  Differential diagnosis pancreatitis versus bowel obstruction versus infectious process.  Will get CBC CMP lipase urinalysis CT scan abdomen and pelvis.  Will give IV fluids IV Dilaudid  IV Phenergan  and reevaluate.  Per my review interpretation labs no significant leukocytosis anemia or electrolyte abnormality or kidney injury.  Paced unremarkable.  CT scans with no acute findings.  He has known splenic vein thrombosis already.  Overall CT imaging unremarkable.  He is feeling much better.  Suspect acute on chronic pain.  Will prescribe Phenergan .  Follow-up with primary care.  Discharged in good condition.  No concern for other emergent process at this time.  I reviewed interpreted labs and  imaging.  This chart was dictated using voice recognition software.  Despite best efforts to proofread,  errors can occur which can change the documentation meaning.      Final diagnoses:  Abdominal pain, unspecified abdominal location  Nausea    ED Discharge Orders          Ordered    promethazine  (PHENERGAN ) 25 MG tablet  Every 6 hours PRN        01/14/24 2149               Dennis Cornet, DO 01/14/24 2150

## 2024-01-14 NOTE — ED Triage Notes (Signed)
 Pt presents to the ED via POV with complaints of RLQ pain that started yesterday - hx of chronic pancreatitis. Endorses some intermittent nausea. Describes the pain as sharp in nature with radiation to the L side of his stomach. A&Ox4 at this time. Denies fevers, chills, vomiting, diarrhea, CP or SOB.

## 2024-01-16 ENCOUNTER — Other Ambulatory Visit (HOSPITAL_COMMUNITY): Payer: Self-pay

## 2024-02-13 ENCOUNTER — Other Ambulatory Visit (HOSPITAL_COMMUNITY): Payer: Self-pay

## 2024-02-13 MED ORDER — TRAZODONE HCL 50 MG PO TABS
50.0000 mg | ORAL_TABLET | Freq: Every day | ORAL | 1 refills | Status: DC
  Filled 2024-02-13: qty 30, 30d supply, fill #0

## 2024-02-29 ENCOUNTER — Other Ambulatory Visit (HOSPITAL_COMMUNITY): Payer: Self-pay

## 2024-02-29 MED ORDER — TRAZODONE HCL 50 MG PO TABS
100.0000 mg | ORAL_TABLET | Freq: Every day | ORAL | 1 refills | Status: DC
  Filled 2024-02-29: qty 30, 15d supply, fill #0
  Filled 2024-03-28: qty 30, 15d supply, fill #1

## 2024-03-08 ENCOUNTER — Other Ambulatory Visit (HOSPITAL_COMMUNITY): Payer: Self-pay

## 2024-03-13 ENCOUNTER — Other Ambulatory Visit (HOSPITAL_COMMUNITY): Payer: Self-pay

## 2024-03-13 MED ORDER — ATORVASTATIN CALCIUM 80 MG PO TABS
80.0000 mg | ORAL_TABLET | Freq: Every day | ORAL | 0 refills | Status: DC
  Filled 2024-03-13: qty 30, 30d supply, fill #0

## 2024-03-13 MED ORDER — FENOFIBRATE 145 MG PO TABS
145.0000 mg | ORAL_TABLET | Freq: Every day | ORAL | 0 refills | Status: DC
  Filled 2024-03-13: qty 30, 30d supply, fill #0

## 2024-04-11 ENCOUNTER — Other Ambulatory Visit (HOSPITAL_COMMUNITY): Payer: Self-pay

## 2024-04-11 MED ORDER — ONDANSETRON 4 MG PO TBDP
4.0000 mg | ORAL_TABLET | Freq: Three times a day (TID) | ORAL | 0 refills | Status: DC
  Filled 2024-04-11: qty 20, 7d supply, fill #0

## 2024-04-12 ENCOUNTER — Other Ambulatory Visit (HOSPITAL_COMMUNITY): Payer: Self-pay

## 2024-04-12 MED ORDER — FENOFIBRATE 145 MG PO TABS
145.0000 mg | ORAL_TABLET | Freq: Every day | ORAL | 3 refills | Status: DC
  Filled 2024-04-12: qty 90, 90d supply, fill #0

## 2024-04-12 MED ORDER — ATORVASTATIN CALCIUM 80 MG PO TABS
80.0000 mg | ORAL_TABLET | Freq: Every day | ORAL | 3 refills | Status: AC
  Filled 2024-04-12: qty 90, 90d supply, fill #0
  Filled 2024-06-21: qty 90, 90d supply, fill #1

## 2024-04-15 ENCOUNTER — Emergency Department (HOSPITAL_COMMUNITY)

## 2024-04-15 ENCOUNTER — Emergency Department (HOSPITAL_COMMUNITY)
Admission: EM | Admit: 2024-04-15 | Discharge: 2024-04-15 | Disposition: A | Discharge status: Home / Self Care | Attending: Emergency Medicine | Admitting: Emergency Medicine

## 2024-04-15 ENCOUNTER — Other Ambulatory Visit: Payer: Self-pay

## 2024-04-15 DIAGNOSIS — Z720 Tobacco use: Secondary | ICD-10-CM | POA: Insufficient documentation

## 2024-04-15 DIAGNOSIS — R079 Chest pain, unspecified: Secondary | ICD-10-CM

## 2024-04-15 DIAGNOSIS — R0602 Shortness of breath: Secondary | ICD-10-CM | POA: Diagnosis not present

## 2024-04-15 DIAGNOSIS — R0781 Pleurodynia: Secondary | ICD-10-CM | POA: Insufficient documentation

## 2024-04-15 DIAGNOSIS — R112 Nausea with vomiting, unspecified: Secondary | ICD-10-CM | POA: Diagnosis not present

## 2024-04-15 LAB — CBC
HCT: 41.1 % (ref 39.0–52.0)
Hemoglobin: 13.7 g/dL (ref 13.0–17.0)
MCH: 30.6 pg (ref 26.0–34.0)
MCHC: 33.3 g/dL (ref 30.0–36.0)
MCV: 91.7 fL (ref 80.0–100.0)
Platelets: 221 K/uL (ref 150–400)
RBC: 4.48 MIL/uL (ref 4.22–5.81)
RDW: 13.8 % (ref 11.5–15.5)
WBC: 4.8 K/uL (ref 4.0–10.5)
nRBC: 0 % (ref 0.0–0.2)

## 2024-04-15 LAB — BASIC METABOLIC PANEL WITH GFR
Anion gap: 11 (ref 5–15)
BUN: 15 mg/dL (ref 6–20)
CO2: 24 mmol/L (ref 22–32)
Calcium: 9.9 mg/dL (ref 8.9–10.3)
Chloride: 106 mmol/L (ref 98–111)
Creatinine, Ser: 1.27 mg/dL — ABNORMAL HIGH (ref 0.61–1.24)
GFR, Estimated: >60 mL/min (ref >60)
Glucose, Bld: 93 mg/dL (ref 70–99)
Potassium: 4.2 mmol/L (ref 3.5–5.1)
Sodium: 140 mmol/L (ref 135–145)

## 2024-04-15 LAB — TROPONIN T, HIGH SENSITIVITY
Troponin T High Sensitivity: <15 ng/L (ref 0–19)
Troponin T High Sensitivity: <15 ng/L (ref 0–19)

## 2024-04-15 MED ORDER — ONDANSETRON HCL 4 MG/2ML IJ SOLN
4.0000 mg | Freq: Once | INTRAMUSCULAR | Status: AC
  Administered 2024-04-15: 4 mg via INTRAVENOUS
  Filled 2024-04-15: qty 2

## 2024-04-15 MED ORDER — SODIUM CHLORIDE 0.9 % IV BOLUS
1000.0000 mL | Freq: Once | INTRAVENOUS | Status: AC
  Administered 2024-04-15: 1000 mL via INTRAVENOUS

## 2024-04-15 MED ORDER — MORPHINE SULFATE (PF) 4 MG/ML IV SOLN
4.0000 mg | Freq: Once | INTRAVENOUS | Status: AC
  Administered 2024-04-15: 4 mg via INTRAVENOUS
  Filled 2024-04-15: qty 1

## 2024-04-15 MED ORDER — IOHEXOL 350 MG/ML SOLN
75.0000 mL | Freq: Once | INTRAVENOUS | Status: AC | PRN
  Administered 2024-04-15: 75 mL via INTRAVENOUS

## 2024-04-15 NOTE — Medical Student Note (Incomplete)
 [facility]-EMERGENCY DEPT Provider Student Note For educational purposes for Medical, PA and NP students only and not part of the legal medical record.   CSN: [id number] Arrival date & time: 04/15/24  1059      History   Chief Complaint Chief Complaint  Patient presents with  . Chest Pain    HPI Dennis [name] is a 33 y.o. male with PMH of chronic pancreatitis, seizures, hx of splenic vien thrombosis (from chart)  - CP radiating to left arm started yesterday, associated with numbness n L arm, N//Vx1, lightheaded, seeing spots, pleuritic CP, no recent illness, travel, long ride, hormonal therapies, no abdominal pain. Not similar to past pancreatitis flare up  - 30 lb weight loss over the past few months- night sweats  - diaphoretic     Chest Pain   Past Medical History:  Diagnosis Date  . Hypertension   . Pancreatitis     Patient Active Problem List   Diagnosis Date Noted  . Dyspnea 11/12/2020  . Melena   . Acute blood loss anemia   . Esophageal varices without bleeding (HCC)   . Abnormal CT of the abdomen   . Acute pancreatitis 09/30/2020  . Hypertriglyceridemia 09/30/2020  . AKI (acute kidney injury) 09/30/2020  . Leukocytosis 09/30/2020    Past Surgical History:  Procedure Laterality Date  . BIOPSY  10/09/2020   Procedure: BIOPSY;  Surgeon: Aneita Gwendlyn DASEN, MD;  Location: [facility] ENDOSCOPY;  Service: Endoscopy;;  . ESOPHAGEAL BANDING  10/09/2020   Procedure: ESOPHAGEAL BANDING;  Surgeon: Aneita Gwendlyn DASEN, MD;  Location: [facility] ENDOSCOPY;  Service: Endoscopy;;  . ESOPHAGEAL BANDING N/A 11/17/2020   Procedure: ESOPHAGEAL BANDING;  Surgeon: Aneita Gwendlyn DASEN, MD;  Location: [facility] ENDOSCOPY;  Service: Endoscopy;  Laterality: N/A;  . ESOPHAGOGASTRODUODENOSCOPY (EGD) WITH PROPOFOL  N/A 10/09/2020   Procedure: ESOPHAGOGASTRODUODENOSCOPY (EGD) WITH PROPOFOL ;  Surgeon: Aneita Gwendlyn DASEN, MD;  Location: [facility] ENDOSCOPY;  Service: Endoscopy;  Laterality: N/A;  .  ESOPHAGOGASTRODUODENOSCOPY (EGD) WITH PROPOFOL  N/A 11/17/2020   Procedure: ESOPHAGOGASTRODUODENOSCOPY (EGD) WITH PROPOFOL ;  Surgeon: Aneita Gwendlyn DASEN, MD;  Location: [facility] ENDOSCOPY;  Service: Endoscopy;  Laterality: N/A;  . IR THORACENTESIS ASP PLEURAL SPACE W/IMG GUIDE  10/10/2020       Home Medications    Prior to Admission medications   Medication Sig Start Date End Date Taking? Authorizing Provider  amLODipine  (NORVASC ) 10 MG tablet Take 1 tablet (10 mg total) by mouth daily. Patient taking differently: Take 10 mg by mouth every evening. 10/14/20 01/12/21  Raenelle Coria, MD  atorvastatin  (LIPITOR) 80 MG tablet Take 1 tablet (80 mg total) by mouth at bedtime. 04/12/24     fenofibrate  (TRICOR ) 145 MG tablet Take 1 tablet (145 mg total) by mouth daily. 04/12/24     fenofibrate  54 MG tablet Take 1 tablet (54 mg total) by mouth daily. Patient taking differently: Take 54 mg by mouth in the morning. 10/14/20 01/12/21  Ghimire, Kuber, MD  ibuprofen (ADVIL) 200 MG tablet Take 400 mg by mouth every 8 (eight) hours as needed (for pain/headaches.).    [provider]  ondansetron  (ZOFRAN -ODT) 4 MG disintegrating tablet Dissolve 1 tablet (4 mg total) on tongue every 8 (eight) hours as needed for nausea or vomiting. 04/11/24     Pancrelipase , Lip-Prot-Amyl, (CREON ) 24000-76000 units CPEP Take 1 capsule by mouth in the morning and 1 capsule at noon and 1 capsule in the evening. Take with meals. 01/13/24     pantoprazole  (PROTONIX ) 40 MG tablet Take 1 tablet (  40 mg total) by mouth 2 (two) times daily. 10/14/20 01/12/21  Ghimire, Kuber, MD  promethazine  (PHENERGAN ) 25 MG tablet Take 1 tablet (25 mg total) by mouth every 6 (six) hours as needed for nausea or vomiting. 01/14/24   Curatolo, Adam, DO  traZODone  (DESYREL ) 50 MG tablet Take 1 tablet (50 mg total) by mouth at bedtime. 02/13/24     traZODone  (DESYREL ) 50 MG tablet Take 2 tablets (100 mg total) by mouth at bedtime. 02/29/24       Family  History Family History  Problem Relation Age of Onset  . Pancreatitis Mother   . Colon cancer Neg Hx   . Stomach cancer Neg Hx   . Esophageal cancer Neg Hx   . Pancreatic cancer Neg Hx     Social History Social History   Tobacco Use  . Smoking status: Every Day    Current packs/day: 0.25    Average packs/day: 0.3 packs/day for 5.0 years (1.3 ttl pk-yrs)    Types: Cigarettes  . Smokeless tobacco: Never  Vaping Use  . Vaping status: Never Used  Substance Use Topics  . Alcohol use: Not Currently    Comment: 10-2020 stopped 3 months ago     Allergies   Patient has no known allergies.   Review of Systems Review of Systems  Cardiovascular:  Positive for chest pain.     Physical Exam Updated Vital Signs BP (!) 168/89 (BP Location: Left Arm)   Pulse 81   Temp 98.1 F (36.7 C) (Oral)   Resp 16   Ht 6' (1.829 m)   Wt 93.4 kg   SpO2 100%   BMI 27.94 kg/m   Physical Exam   ED Treatments / Results  Labs (all labs ordered are listed, but only abnormal results are displayed) Labs Reviewed  CBC  BASIC METABOLIC PANEL WITH GFR  TROPONIN T, HIGH SENSITIVITY    EKG  Radiology DG Chest 2 View Result Date: 04/15/2024 EXAM: 2 VIEW(S) XRAY OF THE CHEST 04/15/2024 11:36:00 AM COMPARISON: 01/14/2024 CLINICAL HISTORY: Chest Pain FINDINGS: LUNGS AND PLEURA: No focal pulmonary opacity. No pleural effusion. No pneumothorax. HEART AND MEDIASTINUM: No acute abnormality of the cardiac and mediastinal silhouettes. BONES AND SOFT TISSUES: No acute osseous abnormality. IMPRESSION: 1. No acute process. Electronically signed by: Fonda Field MD 04/15/2024 11:46 AM EST RP Workstation: [id number]    Procedures Procedures (including critical care time)  Medications Ordered in ED Medications - No data to display   Initial Impression / Assessment and Plan / ED Course  I have reviewed the triage vital signs and the nursing notes.  Pertinent labs & imaging results that were  available during my care of the patient were reviewed by me and considered in my medical decision making (see chart for details).     ***  Final Clinical Impressions(s) / ED Diagnoses   Final diagnoses:  None    New Prescriptions New Prescriptions   No medications on file

## 2024-04-15 NOTE — ED Provider Notes (Signed)
 [place] EMERGENCY DEPARTMENT AT [facility] Provider Note   CSN: [id number] Arrival date & time: 04/15/24  1059     Patient presents with: Chest Pain   Dennis [name] is a 33 y.o. male.   The history is provided by the patient and medical records. No language interpreter was used.  Chest Pain    33 year old male with history of splenic artery thrombosis, alcohol-related pancreatitis, presenting with complaints of chest pain.  Patient endorsed pain to the left side of chest that radiates to his left arm that started since yesterday.  Pain is pleuritic in nature, with some mild shortness of breath.  He did report feeling nauseous and vomited once.  He felt sweaty as well.  He does not endorse any fever or chills no abdominal pain or back pain no hemoptysis and no leg swelling or calf tenderness.  No recent trauma.  States he quit drinking alcohol approximately 2 years ago.  He used to vape on occasion.  He denies any significant cardiac illness.  Prior to Admission medications   Medication Sig Start Date End Date Taking? Authorizing Provider  amLODipine  (NORVASC ) 10 MG tablet Take 1 tablet (10 mg total) by mouth daily. Patient taking differently: Take 10 mg by mouth every evening. 10/14/20 01/12/21  Raenelle Coria, MD  atorvastatin  (LIPITOR) 80 MG tablet Take 1 tablet (80 mg total) by mouth at bedtime. 04/12/24     fenofibrate  (TRICOR ) 145 MG tablet Take 1 tablet (145 mg total) by mouth daily. 04/12/24     fenofibrate  54 MG tablet Take 1 tablet (54 mg total) by mouth daily. Patient taking differently: Take 54 mg by mouth in the morning. 10/14/20 01/12/21  Ghimire, Kuber, MD  ibuprofen (ADVIL) 200 MG tablet Take 400 mg by mouth every 8 (eight) hours as needed (for pain/headaches.).    [provider]  ondansetron  (ZOFRAN -ODT) 4 MG disintegrating tablet Dissolve 1 tablet (4 mg total) on tongue every 8 (eight) hours as needed for nausea or vomiting. 04/11/24      Pancrelipase , Lip-Prot-Amyl, (CREON ) 24000-76000 units CPEP Take 1 capsule by mouth in the morning and 1 capsule at noon and 1 capsule in the evening. Take with meals. 01/13/24     pantoprazole  (PROTONIX ) 40 MG tablet Take 1 tablet (40 mg total) by mouth 2 (two) times daily. 10/14/20 01/12/21  Ghimire, Kuber, MD  promethazine  (PHENERGAN ) 25 MG tablet Take 1 tablet (25 mg total) by mouth every 6 (six) hours as needed for nausea or vomiting. 01/14/24   Curatolo, Adam, DO  traZODone  (DESYREL ) 50 MG tablet Take 1 tablet (50 mg total) by mouth at bedtime. 02/13/24     traZODone  (DESYREL ) 50 MG tablet Take 2 tablets (100 mg total) by mouth at bedtime. 02/29/24       Allergies: Patient has no known allergies.    Review of Systems  Cardiovascular:  Positive for chest pain.  All other systems reviewed and are negative.   Updated Vital Signs BP (!) 168/89 (BP Location: Left Arm)   Pulse 81   Temp 98.1 F (36.7 C) (Oral)   Resp 16   Ht 6' (1.829 m)   Wt 93.4 kg   SpO2 100%   BMI 27.94 kg/m   Physical Exam Constitutional:      General: He is not in acute distress.    Appearance: He is well-developed.  HENT:     Head: Atraumatic.  Eyes:     Conjunctiva/sclera: Conjunctivae normal.  Cardiovascular:  Rate and Rhythm: Normal rate and regular rhythm.     Pulses: Normal pulses.     Heart sounds: Normal heart sounds.  Pulmonary:     Effort: Pulmonary effort is normal.     Breath sounds: Normal breath sounds. No wheezing, rhonchi or rales.  Chest:     Chest wall: Tenderness (Mild left anterior chest wall tenderness no crepitus no emphysema noted.) present.  Abdominal:     Palpations: Abdomen is soft.     Tenderness: There is no abdominal tenderness.  Musculoskeletal:        General: Normal range of motion.     Cervical back: Normal range of motion and neck supple.     Right lower leg: No edema.     Left lower leg: No edema.  Skin:    Findings: No rash.  Neurological:     Mental  Status: He is alert. Mental status is at baseline.     (all labs ordered are listed, but only abnormal results are displayed) Labs Reviewed  BASIC METABOLIC PANEL WITH GFR - Abnormal; Notable for the following components:      Result Value   Creatinine, Ser 1.27 (*)    All other components within normal limits  CBC  TROPONIN T, HIGH SENSITIVITY  TROPONIN T, HIGH SENSITIVITY    EKG: None  Date: 04/15/2024  Rate: 82  Rhythm: normal sinus rhythm  QRS Axis: normal  Intervals: normal  ST/T Wave abnormalities: normal  Conduction Disutrbances: none  Narrative Interpretation:   Old EKG Reviewed: No significant changes noted    Radiology: CT Angio Chest PE W and/or Wo Contrast Result Date: 04/15/2024 EXAM: CTA CHEST 04/15/2024 12:55:52 PM TECHNIQUE: CTA of the chest was performed without and with the administration of 75 mL of iohexol  (OMNIPAQUE ) 350 MG/ML injection. Multiplanar reformatted images are provided for review. MIP images are provided for review. Automated exposure control, iterative reconstruction, and/or weight based adjustment of the mA/kV was utilized to reduce the radiation dose to as low as reasonably achievable. COMPARISON: CT chest, abdomen, and pelvis from 10/11/2020. CLINICAL HISTORY: Pulmonary embolism (PE) suspected, high prob. FINDINGS: PULMONARY ARTERIES: Pulmonary arteries are adequately opacified for evaluation. No acute pulmonary embolus. Main pulmonary artery is normal in caliber. MEDIASTINUM: The heart and pericardium demonstrate no acute abnormality. There is no acute abnormality of the thoracic aorta. LYMPH NODES: No mediastinal, hilar or axillary lymphadenopathy. LUNGS AND PLEURA: Mild scar-. like densities in the inferior lingula and posterior medial right lung base. No focal consolidation or pulmonary edema. No evidence of pleural effusion or pneumothorax. UPPER ABDOMEN: Previously reported low attenuation mass in the region of the pancreatic tail, favored to  represent a large pseudocyst, has an AP diameter of 5.5 cm (image 128/5). Mildly decreased from 6.3 cm previously. SOFT TISSUES AND BONES: No acute bone or soft tissue abnormality. IMPRESSION: 1. No evidence of pulmonary embolism or other acute abnormality. Electronically signed by: Waddell Calk MD 04/15/2024 01:09 PM EST RP Workstation: [id number]   DG Chest 2 View Result Date: 04/15/2024 EXAM: 2 VIEW(S) XRAY OF THE CHEST 04/15/2024 11:36:00 AM COMPARISON: 01/14/2024 CLINICAL HISTORY: Chest Pain FINDINGS: LUNGS AND PLEURA: No focal pulmonary opacity. No pleural effusion. No pneumothorax. HEART AND MEDIASTINUM: No acute abnormality of the cardiac and mediastinal silhouettes. BONES AND SOFT TISSUES: No acute osseous abnormality. IMPRESSION: 1. No acute process. Electronically signed by: Fonda Field MD 04/15/2024 11:46 AM EST RP Workstation: [id number]     Procedures   Medications Ordered in the ED  sodium chloride  0.9 % bolus 1,000 mL (1,000 mLs Intravenous New Bag/Given 04/15/24 1235)  ondansetron  (ZOFRAN ) injection 4 mg (4 mg Intravenous Given 04/15/24 1234)  morphine  (PF) 4 MG/ML injection 4 mg (4 mg Intravenous Given 04/15/24 1234)  iohexol  (OMNIPAQUE ) 350 MG/ML injection 75 mL (75 mLs Intravenous Contrast Given 04/15/24 1247)                                    Medical Decision Making Amount and/or Complexity of Data Reviewed Labs: ordered. Radiology: ordered.  Risk Prescription drug management.   BP (!) 168/89 (BP Location: Left Arm)   Pulse 81   Temp 98.1 F (36.7 C) (Oral)   Resp 16   Ht 6' (1.829 m)   Wt 93.4 kg   SpO2 100%   BMI 27.94 kg/m   53:82 PM  34 year old male with history of splenic artery thrombosis, alcohol-related pancreatitis, presenting with complaints of chest pain.  Patient endorsed pain to the left side of chest that radiates to his left arm that started since yesterday.  Pain is pleuritic in nature, with some mild shortness of breath.  He did  report feeling nauseous and vomited once.  He felt sweaty as well.  He does not endorse any fever or chills no abdominal pain or back pain no hemoptysis and no leg swelling or calf tenderness.  No recent trauma.  States he quit drinking alcohol approximately 2 years ago.  He used to vape on occasion.  He denies any significant cardiac illness.  Exam overall reassuring.  Mild left chest wall tenderness noted.  Intact radial pulse to both arms equally.  No peripheral edema noted.  Since patient mention previous history of splenic artery thrombosis not on any anticoagulant and now endorsing having pleuritic chest pain and shortness of breath, will obtain chest CT angiogram to rule out PE.  Doubt dissection.  Doubt pancreatitis.  -Labs ordered, independently viewed and interpreted by me.  Labs remarkable for creatinine of 1.27, IV fluid given.  Normal WBC, normal H&H, electrolyte panel is otherwise reassuring.  First troponin is negative.  HEART score of 2, low risk of MACE -The patient was maintained on a cardiac monitor.  I personally viewed and interpreted the cardiac monitored which showed an underlying rhythm of: NSR -Imaging independently viewed and interpreted by me and I agree with radiologist's interpretation.  Result remarkable for chest x-ray unremarkable, chest CT angiogram shows no evidence of PE or pneumonia. -This patient presents to the ED for concern of chest pain, this involves an extensive number of treatment options, and is a complaint that carries with it a high risk of complications and morbidity.  The differential diagnosis includes muscle skeletal pain, pleurisy, pneumonia, PE, MI, PTX -Co morbidities that complicate the patient evaluation includes pancreatitis, thrombosis -Treatment includes morphin, zofran , IVF -Reevaluation of the patient after these medicines showed that the patient improved -PCP office notes or outside notes reviewed -Escalation to admission/observation  considered: patients feels much better, is comfortable with discharge, and will follow up with PCP -Prescription medication considered, patient comfortable with OTC meds -Social Determinant of Health considered which includes tobacco use      Final diagnoses:  Nonspecific chest pain    ED Discharge Orders          Ordered    Ambulatory referral to Cardiology       Comments: If you have not heard from the  Cardiology office within the next 72 hours please call [phone].   04/15/24 1400               Nivia Colon, PA-C 04/15/24 1402    Suzette Pac, MD 04/17/24 1048

## 2024-04-15 NOTE — ED Triage Notes (Signed)
 Patient to ED by POV with c/o chest pain that started yesterday. Reports pain that radiates into his left arm causing arm to feel sore. Has HX of chronic pancreatitis. Reports 1 episode of N/V.

## 2024-04-15 NOTE — Discharge Instructions (Signed)
 You have been evaluated for your chest pain.  Fortunately no concerning findings were noted on today's exam.  I have requested for our cardiology office to reach out to you for an outpatient follow-up.  Please also follow-up with your primary care doctor for further care.

## 2024-04-16 ENCOUNTER — Other Ambulatory Visit: Payer: Self-pay | Admitting: Medical Genetics

## 2024-04-20 ENCOUNTER — Other Ambulatory Visit (HOSPITAL_COMMUNITY): Payer: Self-pay

## 2024-04-20 LAB — HM HEPATITIS C SCREENING LAB: HM Hepatitis Screen: NEGATIVE

## 2024-04-20 MED ORDER — ZOLPIDEM TARTRATE 5 MG PO TABS
5.0000 mg | ORAL_TABLET | Freq: Every evening | ORAL | 0 refills | Status: DC
  Filled 2024-04-20: qty 30, 30d supply, fill #0

## 2024-04-24 ENCOUNTER — Other Ambulatory Visit (HOSPITAL_COMMUNITY): Payer: Self-pay

## 2024-04-24 ENCOUNTER — Encounter: Payer: Self-pay | Admitting: Physician Assistant

## 2024-04-24 ENCOUNTER — Ambulatory Visit: Attending: Physician Assistant | Admitting: Physician Assistant

## 2024-04-24 VITALS — BP 128/80 | HR 90 | Ht 72.0 in | Wt 198.0 lb

## 2024-04-24 DIAGNOSIS — R072 Precordial pain: Secondary | ICD-10-CM

## 2024-04-24 DIAGNOSIS — E781 Pure hyperglyceridemia: Secondary | ICD-10-CM

## 2024-04-24 DIAGNOSIS — Z8719 Personal history of other diseases of the digestive system: Secondary | ICD-10-CM

## 2024-04-24 MED ORDER — METOPROLOL TARTRATE 100 MG PO TABS
100.0000 mg | ORAL_TABLET | Freq: Once | ORAL | 0 refills | Status: DC
  Filled 2024-04-24: qty 1, 1d supply, fill #0

## 2024-04-24 NOTE — Progress Notes (Unsigned)
 Cardiology Office Note   Date:  04/24/2024  ID:  Menachem Urbanek, DOB Jul 29, 1990, MRN [id number] PCP: Murleen Fine, MD  [facility] Providers Cardiologist:  [facility] - DOD Dr. Santo  History of Present Illness Dennis [name] is a 33 y.o. male with past medical history of depression, insomnia, hypertension, severe hypertriglyceridemia and history of chronic pancreatitis with pancreatic pseudocyst.  He had hospitalization in 2022 due to jaundice and pancreatitis.  This was initially attributed to heavy alcohol use.  He has quit drinking since.  He did require thoracentesis of the left lung during the hospitalization.  He was seen in the ED on 01/14/2024 with abdominal pain and nausea.  He was given Phenergan  and Dilaudid  blood work including CMP and CBC were normal.  Her lipase 31, within normal limit.  Urinalysis showed a small amount of hemoglobin.  CT of the abdomen and pelvis showed 6.3 x 6.0 cm low-density mass in the region of pancreatic tail abutting the greater curvature of the stomach, this was seen in the same area as previously seen large pseudocyst and it has decreased in study when compared to the previous image.  Outpatient nonemergent MRI was recommended to confirm the diagnosis of pseudocyst.  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  There was extensive short gastric collaterals in the left upper quadrant.  He is on Creon  3 times a day and is scheduled to see GI service in December.  He was previously seen by PCP for insomnia issue and was prescribed Ambien .  He is on Lipitor and fenofibrate . More recently, patient was seen in the ED again on 04/15/2024 with chest pain.  He described as a left-sided chest pain radiating to his left arm.  The pain was pleuritic in nature was some mild shortness of breath.  Blood work showed creatinine of 1.27, normal electrolyte.  Serial troponin negative x 2.   CTA angiogram of the chest was negative for PE or any other acute finding.  Chest x-ray was normal.  Patient was treated with morphine  and discharged from the emergency room.  Triglyceride earlier this year in May was around 6000, this has came down to 140 with combination of both atorvastatin  and fenofibrate .  [facility] for [facility] evaluation of intermittent chest discomfort.  Patient works at [employer] and has to carry heavy object.  In the past several weeks, he has been noticing intermittent left-sided chest pressure radiating down the left arm.  His mother underwent quadruple bypass in her 20s.  Father passed away from COPD and heart failure.  He has not drink alcohol since 2022.  He quit tobacco roughly 2 months ago, he is in the process of quitting vaping as well.  He has previously tried trazodone  which did not work very well for his insomnia.  He is on as needed dose of Ambien  currently.  There is no clear exacerbating factors behind his chest discomfort.  He has no lower extremity edema, orthopnea or PND.  He denies any shortness of breath.  I recommended echocardiogram and a coronary CTA, if both test came back negative, I would not recommend any further workup and he can follow-up as needed.  ROS:   Patient complains of intermittent left-sided chest pain.  He denies significant shortness of breath.  He has no lower extremity edema, orthopnea or PND.  Studies Reviewed          Risk Assessment/Calculations  Physical Exam VS:  BP 128/80   Pulse 90   Ht 6' (1.829 m)   Wt 198 lb (89.8 kg)   SpO2 99%   BMI 26.85 kg/m        Wt Readings from Last 3 Encounters:  04/24/24 198 lb (89.8 kg)  04/15/24 206 lb (93.4 kg)  01/14/24 202 lb (91.6 kg)    GEN: Well nourished, well developed in no acute distress NECK: No JVD; No carotid bruits CARDIAC: RRR, no murmurs, rubs, gallops RESPIRATORY:  Clear to auscultation without rales, wheezing or rhonchi  ABDOMEN:  Soft, non-tender, non-distended EXTREMITIES:  No edema; No deformity   ASSESSMENT AND PLAN  Precordial chest pain: Obtain echocardiogram and a coronary CTA.  If both test come back normal, patient does not need to for further workup.  He will need a single dose of the 100 mg metoprolol  prior to the coronary CTA  Severe hypertriglyceridemia: Significant improvement after adding atorvastatin  and fenofibrate .  Pancreatitis: In the setting of heavy alcohol use and severe hypertriglyceridemia.  Occurred in 2022, patient quit alcohol since.        Dispo: Follow-up as needed if coronary CTA and echocardiogram PA came back normal.  Signed, Scot Ford, PA

## 2024-04-24 NOTE — Patient Instructions (Signed)
 Medication Instructions:  Your physician recommends that you continue on your current medications as directed. Please refer to the Current Medication list given to you today.  *If you need a refill on your cardiac medications before your next appointment, please call your pharmacy*  Lab Work: None ordered  If you have labs (blood work) drawn today and your tests are completely normal, you will receive your results only by: MyChart Message (if you have MyChart) OR A paper copy in the mail If you have any lab test that is abnormal or we need to change your treatment, we will call you to review the results.  Testing/Procedures: Your physician has requested that you have an echocardiogram. Echocardiography is a painless test that uses sound waves to create images of your heart. It provides your doctor with information about the size and shape of your heart and how well your heart's chambers and valves are working. This procedure takes approximately one hour. There are no restrictions for this procedure. Please do NOT wear cologne, perfume, aftershave, or lotions (deodorant is allowed). Please arrive 15 minutes prior to your appointment time.  Please note: We ask at that you not bring children with you during ultrasound (echo/ vascular) testing. Due to room size and safety concerns, children are not allowed in the ultrasound rooms during exams. Our front office staff cannot provide observation of children in our lobby area while testing is being conducted. An adult accompanying a patient to their appointment will only be allowed in the ultrasound room at the discretion of the ultrasound technician under special circumstances. We apologize for any inconvenience.   Your physician has requested that you have cardiac CT. Cardiac computed tomography (CT) is a painless test that uses an x-ray machine to take clear, detailed pictures of your heart. For further information please visit [email or web address]  Please follow instruction sheet BELOW:    Your cardiac CT will be scheduled at one of the below locations:   [facility] [address] [place], [place] [address] [phone] (Severe contrast allergies only)  OR   [facility] [address] [place], [place] [address] [phone]  OR   [facility] [address] [place], [place] [address] [phone]  OR   [facility] and Vascular Tower [address]  [place], [place] [address]  OR   [facility] [address] [place], [place] [phone]  If scheduled at [facility], please arrive at the [facility] and Children's Entrance (Entrance C2) of [facility] 30 minutes prior to test start time. You can use the FREE valet parking offered at entrance C (encouraged to control the heart rate for the test)  Proceed to the [facility] Radiology Department (first floor) to check-in and test prep.  All radiology patients and guests should use entrance C2 at [facility], accessed from [facility], even though the hospital's physical address listed is [address].  If scheduled at the Heart and Vascular Tower at [address], please enter the parking lot using the [address] entrance and use the FREE valet service at the patient drop-off area. Enter the building and check-in with registration on the main floor.  If scheduled at [facility], please arrive to the Heart and Vascular Center 15 mins early for check-in and test prep.  There is spacious parking and easy access to the radiology department from the [facility] Heart and Vascular entrance. Please  enter here and check-in with the desk attendant.   If scheduled at [facility], please arrive 30 minutes early for check-in and test prep.  Please follow these instructions carefully (unless otherwise directed):  An IV will be required  for this test and Nitroglycerin will be given.  Hold all erectile dysfunction medications at least 3 days (72 hrs) prior to test. (Ie viagra, cialis, sildenafil, tadalafil, etc)   On the Night Before the Test: Be sure to Drink plenty of water. Do not consume any caffeinated/decaffeinated beverages or chocolate 12 hours prior to your test. Do not take any antihistamines 12 hours prior to your test.   On the Day of the Test: Drink plenty of water until 1 hour prior to the test. Do not eat any food 1 hour prior to test. HOLD AMLODIPINE  THE DAY OF THE CT  Take metoprolol  (Lopressor ) 100 MG two hours prior to test.  THIS HAS BEEN SENT TO [facility]        After the Test: Drink plenty of water. After receiving IV contrast, you may experience a mild flushed feeling. This is normal. On occasion, you may experience a mild rash up to 24 hours after the test. This is not dangerous. If this occurs, you can take Benadryl 25 mg, Zyrtec, Claritin, or Allegra and increase your fluid intake. (Patients taking Tikosyn should avoid Benadryl, and may take Zyrtec, Claritin, or Allegra) If you experience trouble breathing, this can be serious. If it is severe call 911 IMMEDIATELY. If it is mild, please call our office.  We will call to schedule your test 2-4 weeks out understanding that some insurance companies will need an authorization prior to the service being performed.   For more information and frequently asked questions, please visit our website : [email or web address]  For non-scheduling related questions, please contact the cardiac imaging nurse navigator should you have any questions/concerns: Cardiac Imaging Nurse Navigators Direct Office Dial: [phone]   For scheduling needs, including cancellations and rescheduling, please call Brittany, [phone].   Follow-Up: At [facility] you and your health needs are our priority.  As part of our continuing  mission to provide you with exceptional heart care, our providers are all part of one team.  This team includes your primary Cardiologist (physician) and Advanced Practice Providers or APPs (Physician Assistants and Nurse Practitioners) who all work together to provide you with the care you need, when you need it.  Your next appointment:   DEPENDING UPON TEST RESULTS   Provider:   Scot Ford, PA-C          We recommend signing up for the patient portal called MyChart.  Sign up information is provided on this After Visit Summary.  MyChart is used to connect with patients for Virtual Visits (Telemedicine).  Patients are able to view lab/test results, encounter notes, upcoming appointments, etc.  Non-urgent messages can be sent to your provider as well.   To learn more about what you can do with MyChart, go to [email or web address].   Other Instructions

## 2024-04-30 ENCOUNTER — Ambulatory Visit (HOSPITAL_COMMUNITY)
Admission: RE | Admit: 2024-04-30 | Discharge: 2024-04-30 | Disposition: A | Discharge status: Home / Self Care | Source: Ambulatory Visit | Attending: Physician Assistant | Admitting: Physician Assistant

## 2024-04-30 DIAGNOSIS — R072 Precordial pain: Secondary | ICD-10-CM | POA: Diagnosis not present

## 2024-04-30 LAB — ECHOCARDIOGRAM COMPLETE
Area-P 1/2: 4.86 cm2
S' Lateral: 3.48 cm

## 2024-05-04 ENCOUNTER — Ambulatory Visit: Payer: Self-pay | Admitting: Physician Assistant

## 2024-05-14 ENCOUNTER — Other Ambulatory Visit

## 2024-05-14 DIAGNOSIS — Z006 Encounter for examination for normal comparison and control in clinical research program: Secondary | ICD-10-CM

## 2024-05-15 ENCOUNTER — Other Ambulatory Visit (HOSPITAL_COMMUNITY): Payer: Self-pay

## 2024-05-15 MED ORDER — ZOLPIDEM TARTRATE 5 MG PO TABS: 5.0000 mg | ORAL_TABLET | Freq: Every day | ORAL | 0 refills | Status: DC

## 2024-05-21 ENCOUNTER — Other Ambulatory Visit (HOSPITAL_COMMUNITY): Payer: Self-pay

## 2024-05-21 ENCOUNTER — Encounter: Payer: Self-pay | Admitting: Nurse Practitioner

## 2024-05-21 ENCOUNTER — Encounter: Payer: Self-pay | Admitting: Neurology

## 2024-05-21 ENCOUNTER — Ambulatory Visit (INDEPENDENT_AMBULATORY_CARE_PROVIDER_SITE_OTHER): Admitting: Family Medicine

## 2024-05-21 VITALS — BP 128/84 | HR 80 | Temp 98.1°F | Resp 18 | Ht 72.0 in | Wt 202.2 lb

## 2024-05-21 DIAGNOSIS — F411 Generalized anxiety disorder: Secondary | ICD-10-CM | POA: Diagnosis not present

## 2024-05-21 DIAGNOSIS — K860 Alcohol-induced chronic pancreatitis: Secondary | ICD-10-CM

## 2024-05-21 DIAGNOSIS — R569 Unspecified convulsions: Secondary | ICD-10-CM

## 2024-05-21 DIAGNOSIS — F5101 Primary insomnia: Secondary | ICD-10-CM

## 2024-05-21 DIAGNOSIS — E781 Pure hyperglyceridemia: Secondary | ICD-10-CM

## 2024-05-21 MED ORDER — ESCITALOPRAM OXALATE 10 MG PO TABS
10.0000 mg | ORAL_TABLET | Freq: Every day | ORAL | 2 refills | Status: AC
  Filled 2024-05-21: qty 30, 30d supply, fill #0
  Filled 2024-06-15: qty 30, 30d supply, fill #1
  Filled 2024-06-21 – 2024-07-04 (×3): qty 30, 30d supply, fill #2

## 2024-05-21 MED ORDER — ZOLPIDEM TARTRATE 5 MG PO TABS
5.0000 mg | ORAL_TABLET | Freq: Every day | ORAL | 1 refills | Status: DC
  Filled 2024-05-21: qty 30, 30d supply, fill #0
  Filled 2024-06-07 – 2024-06-12 (×2): qty 30, 30d supply, fill #1

## 2024-05-21 NOTE — Progress Notes (Signed)
 "  New Patient Office Visit  Assessment & Plan:   Assessment & Plan Primary insomnia Chronic insomnia, previously unresponsive to low-dose trazodone . Ambien  effective. No significant snoring or apnea, but irregular breathing noted. - Prescribed Ambien  for insomnia. - Scheduled follow-up in 6 weeks with Sarah for controlled substance agreement. Orders:   zolpidem  (AMBIEN ) 5 MG tablet; Take 1 tablet (5 mg total) by mouth at bedtime.  Generalized anxiety disorder Long-standing anxiety, untreated pharmacologically. Lexapro  selected. Explained need for 4-6 weeks of consistent use to evaluate effectiveness. Emphasized symptom relief without personality change or significant side effects. - Prescribed Lexapro  10 mg daily. Orders:   escitalopram  (LEXAPRO ) 10 MG tablet; Take 1 tablet (10 mg total) by mouth daily.  Seizures (HCC) Recurrent seizures, last episode one month ago. Previous brain scan showed unspecified spot. Seizures involve stiffness without shaking. No recent neurology follow-up. - Referred to neurology for further evaluation and management. Orders:   Ambulatory referral to Neurology  Hypertriglyceridemia Triglycerides well-controlled. Fenofibrate  discontinued, atorvastatin  continued.    Alcohol-induced chronic pancreatitis (HCC) Continue Creon  as prescribed and Zofran  as needed.      Follow-up: Return in about 6 weeks (around 07/02/2024) for anxiety & insomnia.   Niki Rung, MSN, APRN, FNP-C  Subjective:  Patient ID: Dennis [name], male    DOB: [birth year]  Age: 33 y.o. MRN: [id number]  HPI Dajaun Goldring presents to establish care.  Discussed the use of AI scribe software for clinical note transcription with the patient, who gave verbal consent to proceed.  He has a long-standing history of anxiety and depression, which have not been previously treated with medication. He believes these conditions may be contributing to his insomnia.  He has  a history of insomnia and previously tried trazodone  at a low dose of 50 mg, which was ineffective. He took Ambien  from his wife's prescription, which he found effective in helping him sleep. His wife has observed that he sometimes snores and has irregular breathing patterns alternating from slower to faster during sleep, but he does not stop breathing.  He has a history of high blood pressure, which is currently not being treated with medication.   He has high triglycerides and is taking atorvastatin . He was previously on fenofibrate  but was advised to discontinue it, although he takes it occasionally as needed.  He takes Creon  three times a day due to chronic pancreatitis. He occasionally takes Zofran  for nausea as needed.   He also has a history of seizures, which occur randomly and involve stiffening rather than shaking. His last seizure was about a month ago. He was previously on seizure medication, which he stopped due to ineffectiveness. A neurologist identified a spot on his brain, but its significance is unclear. I do not have access to these records. He was previously advised not to drive due to seizures.      ROS: Negative unless specifically indicated above in HPI.  Current Medications[1]  Objective:   Today's Vitals: BP 128/84   Pulse 80   Temp 98.1 F (36.7 C) (Temporal)   Resp 18   Ht 6' (1.829 m)   Wt 202 lb 4 oz (91.7 kg)   BMI 27.43 kg/m   Physical Exam Vitals reviewed.  Constitutional:      General: He is not in acute distress.    Appearance: Normal appearance. He is overweight. He is not ill-appearing, toxic-appearing or diaphoretic.  HENT:     Head: Normocephalic and atraumatic.  Eyes:     General:  No scleral icterus.       Right eye: No discharge.        Left eye: No discharge.     Conjunctiva/sclera: Conjunctivae normal.  Cardiovascular:     Rate and Rhythm: Normal rate and regular rhythm.     Heart sounds: Normal heart sounds. No murmur heard.    No  friction rub. No gallop.  Pulmonary:     Effort: Pulmonary effort is normal. No respiratory distress.     Breath sounds: Normal breath sounds. No stridor. No wheezing, rhonchi or rales.  Musculoskeletal:        General: Normal range of motion.     Cervical back: Normal range of motion.  Skin:    General: Skin is warm and dry.  Neurological:     Mental Status: He is alert and oriented to person, place, and time. Mental status is at baseline.  Psychiatric:        Mood and Affect: Mood normal.        Behavior: Behavior normal.        Thought Content: Thought content normal.        Judgment: Judgment normal.         [1]  Current Outpatient Medications:    atorvastatin  (LIPITOR) 80 MG tablet, Take 1 tablet (80 mg total) by mouth at bedtime., Disp: 90 tablet, Rfl: 3   escitalopram  (LEXAPRO ) 10 MG tablet, Take 1 tablet (10 mg total) by mouth daily., Disp: 30 tablet, Rfl: 2   ondansetron  (ZOFRAN -ODT) 4 MG disintegrating tablet, Dissolve 1 tablet (4 mg total) on tongue every 8 (eight) hours as needed for nausea or vomiting., Disp: 20 tablet, Rfl: 0   Pancrelipase , Lip-Prot-Amyl, (CREON ) 24000-76000 units CPEP, Take 1 capsule by mouth in the morning and 1 capsule at noon and 1 capsule in the evening. Take with meals., Disp: 270 capsule, Rfl: 2   zolpidem  (AMBIEN ) 5 MG tablet, Take 1 tablet (5 mg total) by mouth at bedtime., Disp: 30 tablet, Rfl: 1  "

## 2024-05-21 NOTE — Assessment & Plan Note (Signed)
 Continue Creon  as prescribed and Zofran  as needed.

## 2024-05-21 NOTE — Assessment & Plan Note (Signed)
 Long-standing anxiety, untreated pharmacologically. Lexapro  selected. Explained need for 4-6 weeks of consistent use to evaluate effectiveness. Emphasized symptom relief without personality change or significant side effects. - Prescribed Lexapro  10 mg daily. Orders:   escitalopram  (LEXAPRO ) 10 MG tablet; Take 1 tablet (10 mg total) by mouth daily.

## 2024-05-21 NOTE — Assessment & Plan Note (Signed)
 Chronic insomnia, previously unresponsive to low-dose trazodone . Ambien  effective. No significant snoring or apnea, but irregular breathing noted. - Prescribed Ambien  for insomnia. - Scheduled follow-up in 6 weeks with Sarah for controlled substance agreement. Orders:   zolpidem  (AMBIEN ) 5 MG tablet; Take 1 tablet (5 mg total) by mouth at bedtime.

## 2024-05-21 NOTE — Assessment & Plan Note (Signed)
 Triglycerides well-controlled. Fenofibrate  discontinued, atorvastatin  continued.

## 2024-05-21 NOTE — Assessment & Plan Note (Signed)
 Recurrent seizures, last episode one month ago. Previous brain scan showed unspecified spot. Seizures involve stiffness without shaking. No recent neurology follow-up. - Referred to neurology for further evaluation and management. Orders:   Ambulatory referral to Neurology

## 2024-05-22 LAB — GENECONNECT MOLECULAR SCREEN

## 2024-05-29 ENCOUNTER — Other Ambulatory Visit (HOSPITAL_COMMUNITY): Payer: Self-pay

## 2024-05-29 ENCOUNTER — Other Ambulatory Visit (INDEPENDENT_AMBULATORY_CARE_PROVIDER_SITE_OTHER)

## 2024-05-29 ENCOUNTER — Ambulatory Visit: Admitting: Physician Assistant

## 2024-05-29 ENCOUNTER — Ambulatory Visit: Payer: Self-pay | Admitting: Physician Assistant

## 2024-05-29 ENCOUNTER — Encounter: Payer: Self-pay | Admitting: Physician Assistant

## 2024-05-29 VITALS — BP 136/60 | HR 82 | Ht 72.0 in | Wt 194.2 lb

## 2024-05-29 DIAGNOSIS — K852 Alcohol induced acute pancreatitis without necrosis or infection: Secondary | ICD-10-CM | POA: Diagnosis not present

## 2024-05-29 DIAGNOSIS — K869 Disease of pancreas, unspecified: Secondary | ICD-10-CM | POA: Diagnosis not present

## 2024-05-29 DIAGNOSIS — K766 Portal hypertension: Secondary | ICD-10-CM

## 2024-05-29 DIAGNOSIS — R197 Diarrhea, unspecified: Secondary | ICD-10-CM

## 2024-05-29 DIAGNOSIS — I8500 Esophageal varices without bleeding: Secondary | ICD-10-CM | POA: Diagnosis not present

## 2024-05-29 DIAGNOSIS — K8681 Exocrine pancreatic insufficiency: Secondary | ICD-10-CM | POA: Diagnosis not present

## 2024-05-29 DIAGNOSIS — K860 Alcohol-induced chronic pancreatitis: Secondary | ICD-10-CM

## 2024-05-29 LAB — CBC WITH DIFFERENTIAL/PLATELET
Basophils Absolute: 0 K/uL (ref 0.0–0.1)
Basophils Relative: 0.3 % (ref 0.0–3.0)
Eosinophils Absolute: 0 K/uL (ref 0.0–0.7)
Eosinophils Relative: 0.6 % (ref 0.0–5.0)
HCT: 43.9 % (ref 39.0–52.0)
Hemoglobin: 14.8 g/dL (ref 13.0–17.0)
Lymphocytes Relative: 36.3 % (ref 12.0–46.0)
Lymphs Abs: 2.1 K/uL (ref 0.7–4.0)
MCHC: 33.8 g/dL (ref 30.0–36.0)
MCV: 91.1 fl (ref 78.0–100.0)
Monocytes Absolute: 0.5 K/uL (ref 0.1–1.0)
Monocytes Relative: 7.8 % (ref 3.0–12.0)
Neutro Abs: 3.2 K/uL (ref 1.4–7.7)
Neutrophils Relative %: 55 % (ref 43.0–77.0)
Platelets: 190 K/uL (ref 150.0–400.0)
RBC: 4.81 Mil/uL (ref 4.22–5.81)
RDW: 14.3 % (ref 11.5–15.5)
WBC: 5.9 K/uL (ref 4.0–10.5)

## 2024-05-29 LAB — HEPATIC FUNCTION PANEL
ALT: 11 U/L (ref 3–53)
AST: 14 U/L (ref 5–37)
Albumin: 5.3 g/dL — ABNORMAL HIGH (ref 3.5–5.2)
Alkaline Phosphatase: 60 U/L (ref 39–117)
Bilirubin, Direct: 0.2 mg/dL (ref 0.1–0.3)
Total Bilirubin: 1.2 mg/dL (ref 0.2–1.2)
Total Protein: 8.7 g/dL — ABNORMAL HIGH (ref 6.0–8.3)

## 2024-05-29 LAB — BASIC METABOLIC PANEL WITH GFR
BUN: 15 mg/dL (ref 6–23)
CO2: 29 meq/L (ref 19–32)
Calcium: 9.7 mg/dL (ref 8.4–10.5)
Chloride: 103 meq/L (ref 96–112)
Creatinine, Ser: 1.18 mg/dL (ref 0.40–1.50)
GFR: 81.04 mL/min
Glucose, Bld: 91 mg/dL (ref 70–99)
Potassium: 3.6 meq/L (ref 3.5–5.1)
Sodium: 141 meq/L (ref 135–145)

## 2024-05-29 LAB — LIPASE: Lipase: 15 U/L (ref 11.0–59.0)

## 2024-05-29 MED ORDER — PANCRELIPASE (LIP-PROT-AMYL) 36000-114000 UNITS PO CPEP
ORAL_CAPSULE | ORAL | 2 refills | Status: AC
  Filled 2024-05-29: qty 300, 30d supply, fill #0
  Filled 2024-07-03: qty 300, 30d supply, fill #1

## 2024-05-29 MED ORDER — DICYCLOMINE HCL 20 MG PO TABS
20.0000 mg | ORAL_TABLET | Freq: Three times a day (TID) | ORAL | 0 refills | Status: AC | PRN
  Filled 2024-05-29: qty 60, 20d supply, fill #0

## 2024-05-29 NOTE — Progress Notes (Signed)
 "    05/29/2024 Dennis [name] [id number] 1991/01/01  Referring provider: Murleen Fine, MD Primary GI doctor: Dr. Wilhelmenia (Dr.Stark)  ASSESSMENT AND PLAN:  History of acute recurrent pancreatitis with recent CT showing low-density mass pancreatic tail likely residual pseudocyst and or chronic pancreatitis Initially presented April 2022 thought secondary to alcohol and triglycerides Complicated by history of perigastric fluid collection/pseudocyst History of pleural effusion with thoracentesis Recent admission 06/06 for acute pancreatitis, likely related to Trigs of 5000, continuing AB pain nausea 01/14/2024 CTAP W for abdominal pain 6.3 x 6 cm low-density mass pancreatic tail abutting greater curvature of the stomach same area as previously seen large pseudocyst decreasing in size confirm as originals pseudocyst nonemergent outpatient MRI 01/14/2024 LIPASE 31 04/15/2024 WBC 4.8 HGB 13.7  04/15/2024 BUN 15 Cr 1.27  01/14/2024 AST 19 ALT 16 Alkphos 53 TBili 1.1 04/20/2024 Trigs 140 Will order MRCP to rule out obstruction, evaluate the pseudocyst, may need EUS/cystgastrectomy based off MRCP. will discuss further with our endoscopy specialist.   Will get ANA and IgG4 complete cessation of all ETOH, Patient vapes, told to stop Ultimately may benefit from cholecystectomy. Increase water and do low fat small meals.  Can treat with Creon  for symptoms and Tylenol , lyrica/cymblata for pain.   RUQ pain, early satiety Possible from chronic pancreatitis, versus compression from pseudocyst, versus gallbladder Can radiate to left upper back, can have nausea Had sludge in 11/04/2023 but negative murphy's Will get MRCP, consider HIDA if negative Consider cholecystectomy  Possible from compression by pseudocyst? Will try dicyclomine, consider lyrica/cymbalta if from chronic pancreatitis or not helping  Diarrhea after eating, likely secondary to EPI Has improved with creon  but  continues with diarrhea, he is only on 24000 and only take 3 tablets once a day with food.  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  Negative celiac Still has gastric varices on CT No melena, no signs of bleeding at this time -Check labs for anemia -will likely need an EGD but ideally if patient needs EUS, can consider scheduling with EGD  Normocytic anemia 04/15/2024  HGB 13.7 MCV 91.7 Platelets 221 Recent Labs    01/14/24 1923 04/15/24 1149  HGB 14.6 13.7   Hypertriglyceridemia likely cause of pancreatitis Atorvastatin  and fenofibrate  Recently normal  Hepatic Steatosis seen on imaging without LFT elevation    Latest Ref Rng & Units 01/14/2024    7:23 PM 10/14/2020    1:01 AM 10/12/2020    1:27 AM  Hepatic Function  Total Protein 6.5 - 8.1 g/dL 8.6  5.7  5.3   Albumin 3.5 - 5.0 g/dL 4.8  1.9  1.8   AST 15 - 41 U/L 19  42  29   ALT 0 - 44 U/L 16  35  25   Alk Phosphatase 38 - 126 U/L 53  76  78   Total Bilirubin 0.0 -  1.2 mg/dL 1.1  0.7  0.6    Platelets 221  - Check liver enzymes - If enzymes elevated, may consider additional serologic work-up to rule out concomitant liver disease - Depending on above evaluation and FIB-4, can consider ultrasound elastography to follow-up  - need LFTs and CBC monitored every  6 months, - evaluation with imaging every 2-3 years.  - Encouraged diet/exercise for modest 10% body weight loss as treatment for hepatic steatosis -Continue to work on risk factor modification including diet exercise and control of risk factors including blood sugars. - cessation of alcohol   Patient Care Team: Elnor Lauraine BRAVO, NP as PCP - General (Nurse Practitioner)  HISTORY OF PRESENT ILLNESS: 33 y.o. male with a past medical history listed below presents for evaluation of chronic pancreatitis with complications.   Last seen in the office 11/12/2020 by Jessica Zier, PA for pancreatitis.  Discussed the use of AI scribe software for clinical note transcription with the patient, who gave verbal consent to proceed.  History of Present Illness   Dennis [name] is a 33 year old male with chronic pancreatitis, pancreatic pseudocyst, and hypertriglyceridemia who presents for evaluation of recurrent abdominal pain.  Initially diagnosed with acute pancreatitis in 2022 attributed to alcohol use, complicated by splenic vein thrombosis, esophageal varices requiring endoscopic banding, and development of a pancreatic pseudocyst. Hospitalization at that time was prolonged due to internal bleeding. Multiple subsequent admissions for acute pancreatitis, with a significant flare in June 2025 requiring hospitalization; triglycerides reached 6,000 mg/dL and imaging showed persistent pseudocyst, peripancreatic fat stranding, and gallbladder sludge. Alcohol use ceased over a year ago, with some use after the initial episode but not to previous levels.  Currently experiences intermittent right upper quadrant abdominal pain described as burning, stabbing, and sometimes like a plate grinding in his side, occasionally radiating to the back. Pain is not constant, sometimes worsens after eating, and can be severe enough to require sitting down. Occasional postprandial nausea, sometimes requiring antiemetics,  and loose stools after eating, which vary with diet. Reports dark amber urine despite adequate hydration. Denies melena, hematochezia, dysphagia, jaundice, dysuria, or lower extremity edema. Gallbladder has not been removed. Pain is currently manageable.  Creon  three times daily with meals has led to significant symptom improvement and weight gain after prior weight loss. Current weight is 194 lbs. Fenofibrate  and atorvastatin  have improved triglyceride control. Previously prescribed hydrocodone for pain, now uses ibuprofen as needed. Cardiologist has commented on the severity of his hypertriglyceridemia and potential cardiac risk.  No family history of pancreatitis or pancreatic disease.        He  reports that he has quit smoking. His smoking use included cigarettes. He started smoking about 16 years ago. He has a 1.5 pack-year smoking history. He uses smokeless tobacco. He reports that he does not currently use alcohol. He reports that he does not currently use drugs after having used the following drugs: Cocaine and Marijuana.  RELEVANT GI HISTORY, IMAGING AND LABS: Results   Labs Lipid panel (04/20/2024): Triglycerides elevated; Triglycerides 5060 on 10/2023  Radiology Abdominal CT (12/2023): Chronic pancreatitis with pancreatic pseudocyst causing mass effect on splenic vein; patent splenic vein; peripancreatic fat stranding; hepatic steatosis; intraluminal gallbladder sludge; spleen normal in size without focal abnormality; persistent venous collaterals; no bowel obstruction; no aneurysm     10/11/2020 CT IMPRESSION: 1. Redemonstrated diffuse fat stranding and inflammation about the pancreas, consistent with acute pancreatitis. 2. There is a large pseudocyst interposed between the greater curvature  of the stomach and pancreatic body/tail, slightly increased in size compared to prior examination, measuring 13.2 x 8.9 cm, previously 12.0 x 8.0 cm when measured similarly. The stomach is  displaced and compressed by this large pseudocyst. The presence or absence of infection is not established by CT. 3. The pancreas is not well assessed for parenchymal necrosis by noncontrast CT. 4. There is a moderate to large left pleural effusion and associated atelectasis or consolidation, which is increased compared to prior examination. There appears to be a soft tissue attenuation nodule in the posterior left pleural space measuring 5.2 x 2.9 cm. Given acute development this is most likely clot. 5. Scattered, new nonspecific ground-glass airspace opacities in the anterior right upper lobe. 6. Small volume ascites throughout the low abdomen and pelvis.    EGD on 10/09/2020: - Grade II esophageal varices. Completely eradicated. Banded. - Extrinsic compression in the gastric fundus and in the gastric body (greater curvature, posterior wall). - Extrinsic compression in the gastric antrum (lesser curvature). - Duodenal erosions. Biopsied. Biopsies showed mild nonspecific duodenitis consistent with peptic injury and no features of celiac sprue or granulomas.  CBC    Component Value Date/Time   WBC 4.8 04/15/2024 1149   RBC 4.48 04/15/2024 1149   HGB 13.7 04/15/2024 1149   HCT 41.1 04/15/2024 1149   PLT 221 04/15/2024 1149   MCV 91.7 04/15/2024 1149   MCH 30.6 04/15/2024 1149   MCHC 33.3 04/15/2024 1149   RDW 13.8 04/15/2024 1149   LYMPHSABS 2.1 10/14/2020 0101   MONOABS 1.3 (H) 10/14/2020 0101   EOSABS 0.0 10/14/2020 0101   BASOSABS 0.0 10/14/2020 0101   Recent Labs    01/14/24 1923 04/15/24 1149  HGB 14.6 13.7    CMP     Component Value Date/Time   NA 140 04/15/2024 1149   K 4.2 04/15/2024 1149   CL 106 04/15/2024 1149   CO2 24 04/15/2024 1149   GLUCOSE 93 04/15/2024 1149   BUN 15 04/15/2024 1149   CREATININE 1.27 (H) 04/15/2024 1149   CALCIUM  9.9 04/15/2024 1149   PROT 8.6 (H) 01/14/2024 1923   ALBUMIN 4.8 01/14/2024 1923   AST 19 01/14/2024 1923   ALT 16  01/14/2024 1923   ALKPHOS 53 01/14/2024 1923   BILITOT 1.1 01/14/2024 1923   GFRNONAA >60 04/15/2024 1149      Latest Ref Rng & Units 01/14/2024    7:23 PM 10/14/2020    1:01 AM 10/12/2020    1:27 AM  Hepatic Function  Total Protein 6.5 - 8.1 g/dL 8.6  5.7  5.3   Albumin 3.5 - 5.0 g/dL 4.8  1.9  1.8   AST 15 - 41 U/L 19  42  29   ALT 0 - 44 U/L 16  35  25   Alk Phosphatase 38 - 126 U/L 53  76  78   Total Bilirubin 0.0 - 1.2 mg/dL 1.1  0.7  0.6       Current Medications:   Current Outpatient Medications (Cardiovascular):    atorvastatin  (LIPITOR) 80 MG tablet, Take 1 tablet (80 mg total) by mouth at bedtime.  Current Outpatient Medications (Other):    dicyclomine (BENTYL) 20 MG tablet, Take 1 tablet (20 mg total) by mouth 3 (three) times daily as needed for spasms.   escitalopram  (LEXAPRO ) 10 MG tablet, Take 1 tablet (10 mg total) by mouth daily.   lipase/protease/amylase (CREON ) 36000 UNITS CPEP capsule, 2 tablets before large meal, 1 tablet with snack,  max a day is 10   ondansetron  (ZOFRAN -ODT) 4 MG disintegrating tablet, Dissolve 1 tablet (4 mg total) on tongue every 8 (eight) hours as needed for nausea or vomiting.   zolpidem  (AMBIEN ) 5 MG tablet, Take 1 tablet (5 mg total) by mouth at bedtime.  Medical History:  Past Medical History:  Diagnosis Date   Anxiety    Depression    Esophageal varices without bleeding (HCC)    Hypertension    Hypertriglyceridemia    Insomnia    Pancreatitis    In the setting of heavy alcohol use and severe hypertriglyceridemia.  Occurred in 2022, patient quit alcohol since.   Seizures (HCC)    Allergies: Allergies[1]   Surgical History:  He  has a past surgical history that includes IR THORACENTESIS ASP PLEURAL SPACE W/IMG GUIDE (10/10/2020); Esophagogastroduodenoscopy (egd) with propofol  (N/A, 10/09/2020); esophageal banding (10/09/2020); biopsy (10/09/2020); Esophagogastroduodenoscopy (egd) with propofol  (N/A, 11/17/2020); and esophageal  banding (N/A, 11/17/2020). Family History:  His family history includes Congenital heart disease in his mother; Heart attack in his mother; Heart disease in his father and mother; Heart failure in his father; Pancreatitis in his mother.  REVIEW OF SYSTEMS  : All other systems reviewed and negative except where noted in the History of Present Illness.  PHYSICAL EXAM: BP 136/60   Pulse 82   Ht 6' (1.829 m)   Wt 194 lb 4 oz (88.1 kg)   BMI 26.35 kg/m  Physical Exam   MEASUREMENTS: Weight- 194. GENERAL APPEARANCE: Well nourished, in no apparent distress. Patient appears sweaty. HEENT: No cervical lymphadenopathy, unremarkable thyroid, sclerae anicteric, conjunctiva pink. RESPIRATORY: Respiratory effort normal, BS equal bilateral without rales, rhonchi, wheezing. CARDIO: RRR with no MRGs, peripheral pulses intact. ABDOMEN: Soft, non distended, active bowel sounds in all 4 quadrants, tenderness in right upper and lower quadrants with positive Murphy's sign. RECTAL: Declines. MUSCULOSKELETAL: Full ROM, normal gait, without edema. SKIN: Dry, intact without rashes or lesions. No jaundice. NEURO: Alert, oriented, no focal deficits. PSYCH: Cooperative, normal mood and affect. EXTREMITIES: No edema in legs.      Dennis JONELLE Coombs, PA-C 11:37 AM      [1] No Known Allergies  "

## 2024-05-29 NOTE — Patient Instructions (Addendum)
 Your provider has requested that you go to the basement level for lab work before leaving today. Press B on the elevator. The lab is located at the first door on the left as you exit the elevator.  You have been scheduled for an MRI at [facility] on 06-05-24. Your appointment time is 3pm. Please arrive to admitting (at main entrance of the hospital) 30 minutes prior to your appointment time for registration purposes. Please make certain not to have anything to eat or drink 4 hours prior to your test. In addition, if you have any metal in your body, have a pacemaker or defibrillator, please be sure to let your ordering physician know. This test typically takes 45 minutes to 1 hour to complete. Should you need to reschedule, please call [phone] to do so.  We have given you samples of the following medication to take: Creon   Exp 01-2025 Lot [id number] 24 capsules   VISIT SUMMARY:  You visited us  today to discuss your recurrent abdominal pain related to chronic pancreatitis, a pancreatic pseudocyst, and hypertriglyceridemia. We reviewed your symptoms, current medications, and made adjustments to your treatment plan to help manage your condition more effectively.  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 -Increase Creon  to 36,000 lipase units with meals for better enzyme replacement. -Take dicyclomine up to three times daily as needed for pain. -If dicyclomine is not effective, we may consider pregabalin for pain management. -We ordered an MRCP to check the pseudocyst for any complications. -We will discuss with another physician about the need for a repeat endoscopy or EUS. -Endoscopy will be deferred unless your symptoms worsen or further procedures are needed. -We ordered lab tests to reassess your pancreatic and liver  function.  SPLENIC VEIN THROMBOSIS WITH ESOPHAGEAL VARICES: You have a history of splenic vein thrombosis and esophageal varices, but currently, there is no gastrointestinal bleeding or splenomegaly. -We will coordinate a repeat endoscopy if symptoms develop or in conjunction with other procedures.  HYPERTRIGLYCERIDEMIA: Your triglyceride levels were previously very high but have improved with medication. This condition remains a risk factor for pancreatitis. -Continue taking fenofibrate  and atorvastatin . -We will monitor your lipid levels with lab tests.  GALLBLADDER DYSFUNCTION WITH BILIARY SLUDGE: You have intermittent right upper quadrant pain and post-meal symptoms, possibly due to gallbladder dysfunction or the pseudocyst. -We ordered an MRCP to evaluate your gallbladder and bile ducts. -If the MRCP is inconclusive or symptoms persist, we may consider a HIDA scan. -We discussed the possibility of gallbladder removal if dysfunction is confirmed and symptoms continue.  HEPATIC STEATOSIS: You have fatty liver disease without acute liver issues and have been abstinent from alcohol for almost a year. -We will monitor your liver function with lab tests. -Continue to abstain from alcohol and maintain a healthy lifestyle.

## 2024-05-29 NOTE — Progress Notes (Signed)
"   Attending Physician's Attestation   I have reviewed the chart.   I agree with the Advanced Practitioner's note, impression, and recommendations with any updates as below. Agree with updated imaging.  Complete alcohol abstinence is key.  If he has significant collateralization, cystgastrostomy may not be possible due to variceal development.  Endoscopic evaluation is reasonable to consider, after we get updated imaging.   Aloha Finner, MD [facility] Advanced Endoscopy Office # [phone]  "

## 2024-05-30 LAB — IGG 4: IgG, Subclass 4: 40 mg/dL (ref 2–96)

## 2024-06-01 LAB — ANA: Anti Nuclear Antibody (ANA): NEGATIVE

## 2024-06-02 ENCOUNTER — Encounter: Payer: Self-pay | Admitting: Nurse Practitioner

## 2024-06-04 ENCOUNTER — Other Ambulatory Visit (HOSPITAL_COMMUNITY): Payer: Self-pay

## 2024-06-04 MED ORDER — ZOLPIDEM TARTRATE 5 MG PO TABS
5.0000 mg | ORAL_TABLET | Freq: Every evening | ORAL | 0 refills | Status: DC
  Filled 2024-06-04: qty 15, 30d supply, fill #0
  Filled 2024-06-11 – 2024-06-18 (×2): qty 30, 30d supply, fill #0

## 2024-06-05 ENCOUNTER — Ambulatory Visit (HOSPITAL_COMMUNITY)
Admission: RE | Admit: 2024-06-05 | Discharge: 2024-06-05 | Disposition: A | Discharge status: Home / Self Care | Source: Ambulatory Visit | Attending: Physician Assistant | Admitting: Physician Assistant

## 2024-06-05 DIAGNOSIS — K852 Alcohol induced acute pancreatitis without necrosis or infection: Secondary | ICD-10-CM | POA: Diagnosis present

## 2024-06-05 DIAGNOSIS — K858 Other acute pancreatitis without necrosis or infection: Secondary | ICD-10-CM

## 2024-06-05 MED ORDER — GADOBUTROL 1 MMOL/ML IV SOLN
9.0000 mL | Freq: Once | INTRAVENOUS | Status: AC | PRN
  Administered 2024-06-05: 9 mL via INTRAVENOUS

## 2024-06-06 ENCOUNTER — Other Ambulatory Visit

## 2024-06-06 ENCOUNTER — Other Ambulatory Visit (HOSPITAL_COMMUNITY): Payer: Self-pay

## 2024-06-07 ENCOUNTER — Other Ambulatory Visit

## 2024-06-07 DIAGNOSIS — R197 Diarrhea, unspecified: Secondary | ICD-10-CM

## 2024-06-08 ENCOUNTER — Encounter: Payer: Self-pay | Admitting: Neurology

## 2024-06-08 ENCOUNTER — Ambulatory Visit: Payer: Self-pay | Admitting: Physician Assistant

## 2024-06-08 ENCOUNTER — Ambulatory Visit (INDEPENDENT_AMBULATORY_CARE_PROVIDER_SITE_OTHER): Admitting: Neurology

## 2024-06-08 ENCOUNTER — Other Ambulatory Visit (HOSPITAL_COMMUNITY): Payer: Self-pay

## 2024-06-08 VITALS — BP 151/92 | HR 71 | Ht 72.0 in | Wt 190.0 lb

## 2024-06-08 DIAGNOSIS — K8020 Calculus of gallbladder without cholecystitis without obstruction: Secondary | ICD-10-CM

## 2024-06-08 DIAGNOSIS — G40909 Epilepsy, unspecified, not intractable, without status epilepticus: Secondary | ICD-10-CM

## 2024-06-08 DIAGNOSIS — R1011 Right upper quadrant pain: Secondary | ICD-10-CM

## 2024-06-08 LAB — C. DIFFICILE GDH AND TOXIN A/B
GDH ANTIGEN: NOT DETECTED
MICRO NUMBER:: 17443624
SPECIMEN QUALITY:: ADEQUATE
TOXIN A AND B: NOT DETECTED

## 2024-06-08 MED ORDER — LEVETIRACETAM 500 MG PO TABS
500.0000 mg | ORAL_TABLET | Freq: Two times a day (BID) | ORAL | 11 refills | Status: AC
  Filled 2024-06-08: qty 60, 30d supply, fill #0
  Filled 2024-07-03: qty 60, 30d supply, fill #1

## 2024-06-08 NOTE — Progress Notes (Signed)
 "  NEUROLOGY CONSULTATION NOTE  Dennis [name] MRN: [id number] DOB: 02-21-1991  Referring provider: Niki Rung, FNP Primary care provider: Lauraine Pereyra, NP  Reason for consult:  seizures  Thank you for your kind referral of Dennis [name] for consultation of the above symptoms. Although his history is well known to you, please allow me to reiterate it for the purpose of our medical record. He is alone in the office today.Records and images were personally reviewed where available.  Discussed the use of AI scribe software for clinical note transcription with the patient, who gave verbal consent to proceed.  History of Present Illness This is a 34 year old right-handed man with a history of hypertension, hyperlipidemia, chronic pancreatitis, anxiety, presenting for evaluation of seizures. He began experiencing seizures in his early 34s, occurring at random times. He has the same warning symptoms of feeling sick, dizzy, feeling hot, sweating, and seeing red. He cannot get his words out, then falls to the ground. He was told he does not shake but really tenses up. No tongue bite or incontinence, no nocturnal seizures. The most recent seizure occurred a few days ago in the shower, he felt the warning symptoms then woke up with a mark on his right forehead and injured his elbow. The previous seizure was about two months ago in the kitchen, witnessed by his wife and stepdaughter, where he fell and was confused upon regaining consciousness. He has seizures every couple of months, longest seizure-free interval is 1 year. Sometimes he has the warning symptoms without losing consciousness, he sits and it passes. He was seen by a neurologist in [place] many years ago and was told he had a spot on my brain. He was given a sample pack of unrecalled seizure medication, he states he still had seizures while taking the samples. He stopped it and went seizure-free for a year, then seizures  recurred. He denies any staring episodes, no olfactory/gustatory hallucinations, focal numbness/tingling/weakness, myoclonic jerks. He has occasional twitching, more in his sleep.   He experiences headaches every other day, described as a pressure sensation in the temples, sometimes accompanied by nausea. He takes Tylenol  for relief, with variable effectiveness. His mother and other family members also experience migraines. He denies any diplopia, dysarthria/dysphagia, neck pain, bowel/bladder dysfunction. He has low back pain that he attributes to chronic pancreatitis, with burning/stabbing in the lower back radiating upwards. He has been evaluated by a cardiologist in the past for separate issues, and no cardiac abnormalities were found. He experiences occasional heart palpitations, sometimes associated with the onset of a seizure.He has sleep difficulties, getting only two to three hours of sleep per night despite taking Ambien . He sometimes needs to take two pills to achieve any sleep, but even this is not consistently effective. Mood and memory are good. He works as a designer, television/film set at [employer]. He denies any alcohol intake in 2 years.  His younger sister has seizures. He had a normal birth and early development.  There is no history of febrile convulsions, CNS infections such as meningitis/encephalitis, significant traumatic brain injury, neurosurgical procedures.   PAST MEDICAL HISTORY: Past Medical History:  Diagnosis Date   Anxiety    Depression    Esophageal varices without bleeding (HCC)    Hypertension    Hypertriglyceridemia    Insomnia    Pancreatitis    In the setting of heavy alcohol use and severe hypertriglyceridemia.  Occurred in 2022, patient quit alcohol since.   Seizures (HCC)  PAST SURGICAL HISTORY: Past Surgical History:  Procedure Laterality Date   BIOPSY  10/09/2020   Procedure: BIOPSY;  Surgeon: Aneita Gwendlyn DASEN, MD;  Location: [facility] ENDOSCOPY;  Service: Endoscopy;;    ESOPHAGEAL BANDING  10/09/2020   Procedure: ESOPHAGEAL BANDING;  Surgeon: Aneita Gwendlyn DASEN, MD;  Location: [facility] ENDOSCOPY;  Service: Endoscopy;;   ESOPHAGEAL BANDING N/A 11/17/2020   Procedure: ESOPHAGEAL BANDING;  Surgeon: Aneita Gwendlyn DASEN, MD;  Location: [facility] ENDOSCOPY;  Service: Endoscopy;  Laterality: N/A;   ESOPHAGOGASTRODUODENOSCOPY (EGD) WITH PROPOFOL  N/A 10/09/2020   Procedure: ESOPHAGOGASTRODUODENOSCOPY (EGD) WITH PROPOFOL ;  Surgeon: Aneita Gwendlyn DASEN, MD;  Location: [facility] ENDOSCOPY;  Service: Endoscopy;  Laterality: N/A;   ESOPHAGOGASTRODUODENOSCOPY (EGD) WITH PROPOFOL  N/A 11/17/2020   Procedure: ESOPHAGOGASTRODUODENOSCOPY (EGD) WITH PROPOFOL ;  Surgeon: Aneita Gwendlyn DASEN, MD;  Location: [facility] ENDOSCOPY;  Service: Endoscopy;  Laterality: N/A;   IR THORACENTESIS RIGHT ASP PLEURAL SPACE W/IMG GUIDE  10/10/2020    MEDICATIONS: Medications Ordered Prior to Encounter[1]  ALLERGIES: Allergies[2]  FAMILY HISTORY: Family History  Problem Relation Age of Onset   Heart attack Mother    Heart disease Mother    Pancreatitis Mother    Congenital heart disease Mother    Heart failure Father    Heart disease Father    Colon cancer Neg Hx    Stomach cancer Neg Hx    Esophageal cancer Neg Hx    Pancreatic cancer Neg Hx     SOCIAL HISTORY: Social History   Socioeconomic History   Marital status: Married    Spouse name: Not on file   Number of children: Not on file   Years of education: Not on file   Highest education level: 12th grade  Occupational History   Not on file  Tobacco Use   Smoking status: Former    Average packs/day: 0.3 packs/day for 5.0 years (1.5 ttl pk-yrs)    Types: Cigarettes    Start date: 2010   Smokeless tobacco: Current   Tobacco comments:    I vape the nicotine disposal vapes but no cigarettes. Im trying to slowly quit in general  Vaping Use   Vaping status: Every Day  Substance and Sexual Activity   Alcohol use: Not Currently    Comment: I was diagnosed with  acute/chronic pancreatitis due to alcohol and ended up with internal bleeding. I havent drank in about two years   Drug use: Not Currently    Types: Cocaine, Marijuana    Comment: Ive smoked marijuana  and ive tried cocaine and it wasnt for me.   Sexual activity: Yes    Birth control/protection: None    Comment: Id like to be tested for any sexual disease if possible just to be on the safe side  Other Topics Concern   Not on file  Social History Narrative   Are you right handed or left handed? Right Handed   Are you currently employed ? Yes   What is your current occupation? [employer]   Do you live at home alone? No    Who lives with you? Wife and step daughter   What type of home do you live in: 1 story or 2 story? One story condo       Social Drivers of Health   Tobacco Use: High Risk (06/08/2024)   Patient History    Smoking Tobacco Use: Former    Smokeless Tobacco Use: Current    Passive Exposure: Not on file  Financial Resource Strain: Medium Risk (05/18/2024)   Overall  Financial Resource Strain (CARDIA)    Difficulty of Paying Living Expenses: Somewhat hard  Food Insecurity: Food Insecurity Present (05/18/2024)   Epic    Worried About Programme Researcher, Broadcasting/film/video in the Last Year: Sometimes true    Ran Out of Food in the Last Year: Never true  Transportation Needs: No Transportation Needs (05/18/2024)   Epic    Lack of Transportation (Medical): No    Lack of Transportation (Non-Medical): No  Physical Activity: Insufficiently Active (05/18/2024)   Exercise Vital Sign    Days of Exercise per Week: 2 days    Minutes of Exercise per Session: 60 min  Stress: Stress Concern Present (05/18/2024)   [employer] of Occupational Health - Occupational Stress Questionnaire    Feeling of Stress: To some extent  Social Connections: Moderately Integrated (05/18/2024)   Social Connection and Isolation Panel    Frequency of Communication with Friends and Family: Twice a week    Frequency of  Social Gatherings with Friends and Family: Once a week    Attends Religious Services: 1 to 4 times per year    Active Member of [employer] or Organizations: No    Attends Engineer, Structural: Not on file    Marital Status: Married  Catering Manager Violence: Not on file  Depression (PHQ2-9): Medium Risk (05/21/2024)   Depression (PHQ2-9)    PHQ-2 Score: 7  Alcohol Screen: Not on file  Housing: High Risk (05/18/2024)   Epic    Unable to Pay for Housing in the Last Year: Yes    Number of Times Moved in the Last Year: 1    Homeless in the Last Year: No  Utilities: Low Risk (01/25/2024)   Received from [facility]   Utilities    In the past 12 months has the electric, gas, oil, or water company threatened to shut off services in your home? : No  Health Literacy: Not on file     PHYSICAL EXAM: Vitals:   06/08/24 0815  BP: (!) 151/92  Pulse: 71  SpO2: 99%   General: No acute distress Head:  Normocephalic/atraumatic Skin/Extremities: No rash, no edema Neurological Exam: Mental status: alert and oriented to person, place, and time, no dysarthria or aphasia, Fund of knowledge is appropriate.  Recent and remote memory are intact, 3/3 delayed recall.  Attention and concentration are normal, 5/5 WORLD backwards.  Cranial nerves: CN I: not tested CN II: pupils equal, round, visual fields intact CN III, IV, VI:  full range of motion, no nystagmus, no ptosis CN V: facial sensation intact CN VII: upper and lower face symmetric CN VIII: hearing intact to conversation Bulk & Tone: normal, no fasciculations. Motor: 5/5 throughout with no pronator drift. Sensation: intact to light touch, cold, pin, vibration sense.  No extinction to double simultaneous stimulation.  Romberg test negative Deep Tendon Reflexes: +1 both UE, +2 both LE Cerebellar: no incoordination on finger to nose testing Gait: narrow-based and steady, able to tandem walk adequately. Tremor:  none   IMPRESSION: This is a 34 year old right-handed man with a history of hypertension, hyperlipidemia, chronic pancreatitis, anxiety, presenting for evaluation of seizures. Last seizure was a few days ago, he has a seizure every couple of months. MRI brain with and without contrast and 1-hour EEG will be ordered for characterization. We discussed starting Levetiracetam  500mg  at bedtime for 1 week, then increase to 500mg  BID. Side effects discussed. He was advised to keep a seizure calendar as we start medication. [place]  driving laws were discussed with the patient, and he knows to stop driving after a seizure, until 6 months seizure-free.     Thank you for allowing me to participate in the care of this patient. Please do not hesitate to call for any questions or concerns.   Darice Shivers, M.D.  CC: Niki Rung, FNP, Lauraine Pereyra, NP     [1]  Current Outpatient Medications on File Prior to Visit  Medication Sig Dispense Refill   atorvastatin  (LIPITOR) 80 MG tablet Take 1 tablet (80 mg total) by mouth at bedtime. 90 tablet 3   dicyclomine  (BENTYL ) 20 MG tablet Take 1 tablet (20 mg total) by mouth 3 (three) times daily as needed for spasms. 60 tablet 0   escitalopram  (LEXAPRO ) 10 MG tablet Take 1 tablet (10 mg total) by mouth daily. 30 tablet 2   lipase/protease/amylase (CREON ) 36000 UNITS CPEP capsule Take 2 tablets by mouth before large meal, 1 tablet with snack, max a day is 10 300 capsule 2   lipase/protease/amylase (CREON ) 36000 UNITS CPEP capsule Take by mouth. We have given you samples of the following medication to take: Creon   Exp 01-2025 Lot [id number] 24 capsules     ondansetron  (ZOFRAN -ODT) 4 MG disintegrating tablet Dissolve 1 tablet (4 mg total) on tongue every 8 (eight) hours as needed for nausea or vomiting. 20 tablet 0   zolpidem  (AMBIEN ) 5 MG tablet Take 1 tablet (5 mg total) by mouth at bedtime. 30 tablet 1   zolpidem  (AMBIEN ) 5 MG tablet Take 1 tablet (5 mg total) by mouth  nightly as needed for sleep. 30 tablet 0   No current facility-administered medications on file prior to visit.  [2] No Known Allergies  "

## 2024-06-08 NOTE — Patient Instructions (Signed)
 Good to meet you.  Schedule MRI brain with and without contrast  2. Schedule 1-hour EEG  3. Start Keppra  (Levetiracetam ) 500mg : take 1 tablet every night for 1 week, then increase to 1 tablet twice a day  4. Follow-up in 3 months, call for any changes   Seizure Precautions: 1. If medication has been prescribed for you to prevent seizures, take it exactly as directed.  Do not stop taking the medicine without talking to your doctor first, even if you have not had a seizure in a long time.   2. Avoid activities in which a seizure would cause danger to yourself or to others.  Don't operate dangerous machinery, swim alone, or climb in high or dangerous places, such as on ladders, roofs, or girders.  Do not drive unless your doctor says you may.  3. If you have any warning that you may have a seizure, lay down in a safe place where you can't hurt yourself.    4.  No driving for 6 months from last seizure, as per [place]  state law.   Please refer to the following link on the Epilepsy Foundation of America's website for more information: http://www.epilepsyfoundation.org/answerplace/Social/driving/drivingu.cfm   5.  Maintain good sleep hygiene. Avoid alcohol.  6.  Contact your doctor if you have any problems that may be related to the medicine you are taking.  7.  Call 911 and bring the patient back to the ED if:        A.  The seizure lasts longer than 5 minutes.       B.  The patient doesn't awaken shortly after the seizure  C.  The patient has new problems such as difficulty seeing, speaking or moving  D.  The patient was injured during the seizure  E.  The patient has a temperature over 102 F (39C)  F.  The patient vomited and now is having trouble breathing

## 2024-06-09 LAB — CALPROTECTIN, FECAL: Calprotectin, Fecal: 12 ug/g (ref 0–120)

## 2024-06-10 ENCOUNTER — Emergency Department (HOSPITAL_COMMUNITY)
Admission: EM | Admit: 2024-06-10 | Discharge: 2024-06-10 | Disposition: A | Discharge status: Home / Self Care | Attending: Emergency Medicine | Admitting: Emergency Medicine

## 2024-06-10 ENCOUNTER — Emergency Department (HOSPITAL_COMMUNITY)

## 2024-06-10 DIAGNOSIS — K8020 Calculus of gallbladder without cholecystitis without obstruction: Secondary | ICD-10-CM | POA: Diagnosis not present

## 2024-06-10 DIAGNOSIS — R1011 Right upper quadrant pain: Secondary | ICD-10-CM | POA: Diagnosis present

## 2024-06-10 LAB — CBC WITH DIFFERENTIAL/PLATELET
Abs Immature Granulocytes: 0.03 K/uL (ref 0.00–0.07)
Basophils Absolute: 0 K/uL (ref 0.0–0.1)
Basophils Relative: 0 %
Eosinophils Absolute: 0 K/uL (ref 0.0–0.5)
Eosinophils Relative: 0 %
HCT: 46.6 % (ref 39.0–52.0)
Hemoglobin: 15.8 g/dL (ref 13.0–17.0)
Immature Granulocytes: 0 %
Lymphocytes Relative: 33 %
Lymphs Abs: 4 K/uL (ref 0.7–4.0)
MCH: 31.4 pg (ref 26.0–34.0)
MCHC: 33.9 g/dL (ref 30.0–36.0)
MCV: 92.6 fL (ref 80.0–100.0)
Monocytes Absolute: 0.6 K/uL (ref 0.1–1.0)
Monocytes Relative: 5 %
Neutro Abs: 7.3 K/uL (ref 1.7–7.7)
Neutrophils Relative %: 62 %
Platelets: 250 K/uL (ref 150–400)
RBC: 5.03 MIL/uL (ref 4.22–5.81)
RDW: 13.6 % (ref 11.5–15.5)
WBC: 12 K/uL — ABNORMAL HIGH (ref 4.0–10.5)
nRBC: 0 % (ref 0.0–0.2)

## 2024-06-10 LAB — COMPREHENSIVE METABOLIC PANEL WITH GFR
ALT: 13 U/L (ref 0–44)
AST: 20 U/L (ref 15–41)
Albumin: 5.2 g/dL — ABNORMAL HIGH (ref 3.5–5.0)
Alkaline Phosphatase: 68 U/L (ref 38–126)
Anion gap: 12 (ref 5–15)
BUN: 16 mg/dL (ref 6–20)
CO2: 25 mmol/L (ref 22–32)
Calcium: 9.9 mg/dL (ref 8.9–10.3)
Chloride: 105 mmol/L (ref 98–111)
Creatinine, Ser: 1.22 mg/dL (ref 0.61–1.24)
GFR, Estimated: >60 mL/min
Glucose, Bld: 122 mg/dL — ABNORMAL HIGH (ref 70–99)
Potassium: 4.5 mmol/L (ref 3.5–5.1)
Sodium: 142 mmol/L (ref 135–145)
Total Bilirubin: 0.9 mg/dL (ref 0.0–1.2)
Total Protein: 8.4 g/dL — ABNORMAL HIGH (ref 6.5–8.1)

## 2024-06-10 LAB — URINALYSIS, ROUTINE W REFLEX MICROSCOPIC
Bacteria, UA: NONE SEEN
Bilirubin Urine: NEGATIVE
Glucose, UA: NEGATIVE mg/dL
Ketones, ur: NEGATIVE mg/dL
Leukocytes,Ua: NEGATIVE
Nitrite: NEGATIVE
Protein, ur: NEGATIVE mg/dL
Specific Gravity, Urine: 1.026 (ref 1.005–1.030)
pH: 5 (ref 5.0–8.0)

## 2024-06-10 LAB — LIPASE, BLOOD: Lipase: 107 U/L — ABNORMAL HIGH (ref 11–51)

## 2024-06-10 MED ORDER — OXYCODONE-ACETAMINOPHEN 5-325 MG PO TABS
1.0000 | ORAL_TABLET | Freq: Once | ORAL | Status: AC
  Administered 2024-06-10: 1 via ORAL
  Filled 2024-06-10: qty 1

## 2024-06-10 MED ORDER — OXYCODONE-ACETAMINOPHEN 5-325 MG PO TABS: 1.0000 | ORAL_TABLET | Freq: Four times a day (QID) | ORAL | 0 refills | Status: DC | PRN

## 2024-06-10 MED ORDER — OXYCODONE-ACETAMINOPHEN 5-325 MG PO TABS
1.0000 | ORAL_TABLET | Freq: Four times a day (QID) | ORAL | 0 refills | Status: AC | PRN
  Filled 2024-06-10: qty 7, 2d supply, fill #0

## 2024-06-10 MED ORDER — ONDANSETRON 4 MG PO TBDP
4.0000 mg | ORAL_TABLET | Freq: Once | ORAL | Status: AC
  Administered 2024-06-10: 4 mg via ORAL
  Filled 2024-06-10: qty 1

## 2024-06-10 MED ORDER — ONDANSETRON 4 MG PO TBDP
4.0000 mg | ORAL_TABLET | Freq: Three times a day (TID) | ORAL | 0 refills | Status: AC | PRN
  Filled 2024-06-10: qty 20, 21d supply, fill #0

## 2024-06-10 MED ORDER — LACTATED RINGERS IV BOLUS
1000.0000 mL | Freq: Once | INTRAVENOUS | Status: AC
  Administered 2024-06-10: 1000 mL via INTRAVENOUS

## 2024-06-10 MED ORDER — KETOROLAC TROMETHAMINE 15 MG/ML IJ SOLN
15.0000 mg | Freq: Once | INTRAMUSCULAR | Status: AC
  Administered 2024-06-10: 15 mg via INTRAVENOUS
  Filled 2024-06-10: qty 1

## 2024-06-10 MED ORDER — ONDANSETRON 4 MG PO TBDP: 4.0000 mg | ORAL_TABLET | Freq: Three times a day (TID) | ORAL | 0 refills | Status: DC | PRN

## 2024-06-10 NOTE — ED Provider Notes (Signed)
 " [place] EMERGENCY DEPARTMENT AT [facility] Provider Note   CSN: [id number] Arrival date & time: 06/10/24  1446     Patient presents with: Abdominal Pain   Dennis [name] is a 34 y.o. male with past medical history of alcohol induced chronic pancreatitis, symptomatic cholelithiasis, hypertriglyceridemia, seizures, who presents emergency department for evaluation of right upper quadrant pain that started last night.  Patient does report some nausea, without vomiting.  He denies any chest pain or shortness of breath.  Patient does state that eating makes the pain significantly worse.  He describes the pain as starting in his right upper quadrant and wrapping around to his back.  He denies any fevers, chills or bodyaches.    Abdominal Pain      Prior to Admission medications  Medication Sig Start Date End Date Taking? Authorizing Provider  ondansetron  (ZOFRAN -ODT) 4 MG disintegrating tablet Take 1 tablet (4 mg total) by mouth every 8 (eight) hours as needed for nausea or vomiting. 06/10/24  Yes Deniz Eskridge, Marry RAMAN, PA-C  oxyCODONE -acetaminophen  (PERCOCET/ROXICET) 5-325 MG tablet Take 1 tablet by mouth every 6 (six) hours as needed for up to 7 days for severe pain (pain score 7-10). 06/10/24 06/17/24 Yes Vylette Strubel, Marry RAMAN, PA-C  atorvastatin  (LIPITOR) 80 MG tablet Take 1 tablet (80 mg total) by mouth at bedtime. 04/12/24     dicyclomine  (BENTYL ) 20 MG tablet Take 1 tablet (20 mg total) by mouth 3 (three) times daily as needed for spasms. 05/29/24   Craig Alan SAUNDERS, PA-C  escitalopram  (LEXAPRO ) 10 MG tablet Take 1 tablet (10 mg total) by mouth daily. 05/21/24   Merlynn Niki FALCON, FNP  levETIRAcetam  (KEPPRA ) 500 MG tablet Take 1 tablet (500 mg total) by mouth 2 (two) times daily. 06/08/24   Georjean Darice HERO, MD  lipase/protease/amylase (CREON ) 36000 UNITS CPEP capsule Take 2 tablets by mouth before large meal, 1 tablet with snack, max a day is 10 05/29/24   Craig Alan SAUNDERS, PA-C  lipase/protease/amylase (CREON ) 36000 UNITS CPEP capsule Take by mouth. We have given you samples of the following medication to take: Creon   Exp 01-2025 Lot [id number] 24 capsules    [provider]  zolpidem  (AMBIEN ) 5 MG tablet Take 1 tablet (5 mg total) by mouth at bedtime. 05/21/24   Merlynn Niki FALCON, FNP  zolpidem  (AMBIEN ) 5 MG tablet Take 1 tablet (5 mg total) by mouth nightly as needed for sleep. 06/04/24       Allergies: Patient has no known allergies.    Review of Systems  Gastrointestinal:  Positive for abdominal pain.    Updated Vital Signs BP (!) 153/89   Pulse 72   Temp 98.4 F (36.9 C)   Resp 16   Wt 76.6 kg   SpO2 100%   BMI 22.91 kg/m   Physical Exam Vitals and nursing note reviewed.  Constitutional:      Appearance: Normal appearance.  HENT:     Head: Normocephalic and atraumatic.     Mouth/Throat:     Mouth: Mucous membranes are moist.  Eyes:     General: No scleral icterus.       Right eye: No discharge.        Left eye: No discharge.     Conjunctiva/sclera: Conjunctivae normal.  Cardiovascular:     Rate and Rhythm: Normal rate and regular rhythm.     Pulses: Normal pulses.  Pulmonary:     Effort: Pulmonary effort is normal.  Breath sounds: Normal breath sounds.  Abdominal:     General: There is no distension.     Tenderness: There is abdominal tenderness in the right upper quadrant.     Comments: Right upper quadrant tenderness to palpation.  No CVA tenderness noted  Musculoskeletal:        General: No deformity.     Cervical back: Normal range of motion.  Skin:    General: Skin is warm and dry.     Capillary Refill: Capillary refill takes less than 2 seconds.  Neurological:     Mental Status: He is alert.     Motor: No weakness.  Psychiatric:        Mood and Affect: Mood normal.     (all labs ordered are listed, but only abnormal results are displayed) Labs Reviewed  CBC WITH DIFFERENTIAL/PLATELET - Abnormal; Notable  for the following components:      Result Value   WBC 12.0 (*)    All other components within normal limits  COMPREHENSIVE METABOLIC PANEL WITH GFR - Abnormal; Notable for the following components:   Glucose, Bld 122 (*)    Total Protein 8.4 (*)    Albumin 5.2 (*)    All other components within normal limits  LIPASE, BLOOD - Abnormal; Notable for the following components:   Lipase 107 (*)    All other components within normal limits  URINALYSIS, ROUTINE W REFLEX MICROSCOPIC - Abnormal; Notable for the following components:   Hgb urine dipstick SMALL (*)    All other components within normal limits    EKG: None  Radiology: US  Abdomen Limited RUQ (LIVER/GB) Result Date: 06/10/2024 CLINICAL DATA:  Right upper quadrant pain EXAM: ULTRASOUND ABDOMEN LIMITED RIGHT UPPER QUADRANT COMPARISON:  09/29/2020, 06/05/2024 FINDINGS: Gallbladder: No gallstones or wall thickening visualized. No sonographic Murphy sign noted by sonographer. Common bile duct: Diameter: 3 mm Liver: Grossly normal liver echotexture without focal abnormality or intrahepatic biliary duct dilation. Portal vein is patent on color Doppler imaging with normal direction of blood flow towards the liver. Other: None. IMPRESSION: 1. Unremarkable right upper quadrant ultrasound. Electronically Signed   By: Ozell Daring M.D.   On: 06/10/2024 16:49    Procedures   Medications Ordered in the ED  oxyCODONE -acetaminophen  (PERCOCET/ROXICET) 5-325 MG per tablet 1 tablet (1 tablet Oral Given 06/10/24 1539)  ondansetron  (ZOFRAN -ODT) disintegrating tablet 4 mg (4 mg Oral Given 06/10/24 1539)  lactated ringers  bolus 1,000 mL (1,000 mLs Intravenous New Bag/Given 06/10/24 1938)  ketorolac  (TORADOL ) 15 MG/ML injection 15 mg (15 mg Intravenous Given 06/10/24 1938)                                Medical Decision Making  This patient presents to the ED for concern of upper quadrant pain, this involves an extensive number of treatment options, and  is a complaint that carries with it a high risk of complications and morbidity.   Differential diagnosis includes: UTI, pyelonephritis, ureterolithiasis, cholecystitis, symptomatic cholelithiasis, choledocholithiasis, pancreatitis, GERD, AAA  Co morbidities:  alcohol-induced pancreatitis, hypertriglyceridemia, symptomatic cholelithiasis  Additional history:  Patient is a history of chronic alcohol induced pancreatitis and follows with GI.  He also has a history of varices requiring banding.  Lab Tests:  I Ordered, and personally interpreted labs.  The pertinent results include:    - Leukocytosis: 12.0 - Lipase: 107  Imaging Studies:  I ordered imaging studies including right upper quadrant ultrasound  I independently visualized and interpreted imaging which showed no evidence of cholecystitis or biliary sludge I agree with the radiologist interpretation  Cardiac Monitoring/ECG:  The patient was maintained on a cardiac monitor.  I personally viewed and interpreted the cardiac monitored which showed an underlying rhythm of: [place] rhythm  Medicines ordered and prescription drug management:  I ordered medication including  Medications  oxyCODONE -acetaminophen  (PERCOCET/ROXICET) 5-325 MG per tablet 1 tablet (1 tablet Oral Given 06/10/24 1539)  ondansetron  (ZOFRAN -ODT) disintegrating tablet 4 mg (4 mg Oral Given 06/10/24 1539)  lactated ringers  bolus 1,000 mL (1,000 mLs Intravenous New Bag/Given 06/10/24 1938)  ketorolac  (TORADOL ) 15 MG/ML injection 15 mg (15 mg Intravenous Given 06/10/24 1938)   for pain control and nausea Reevaluation of the patient after these medicines showed that the patient improved I have reviewed the patients home medicines and have made adjustments as needed  Test Considered:   none  Critical Interventions:   none  Consultations Obtained: None  Problem List / ED Course:     ICD-10-CM   1. Symptomatic cholelithiasis  K80.20       MDM:  34 year old male who presents emergency department for evaluation of right upper quadrant pain.  He does have a history of chronic pancreatitis as well as symptomatic cholelithiasis.  Patient is followed by GI, who did mention that he may require a cholecystectomy in the future.  He does have nausea without vomiting today.  He denies chest pain or shortness of breath and is not diaphoretic, less likely cardiac in nature.  His lab work is significant for mild leukocytosis of 12.0 and a lipase of 107.  However, this does not meet pancreatitis criteria, so less likely to be pancreatic etiology.  Patient did undergo right upper quadrant ultrasound, which was negative for biliary sludge, stranding or infection, so less likely cholecystitis.  I do suspect patient symptoms are likely secondary to symptomatic cholelithiasis, warranting a general surgery outpatient follow-up.  I did give him IV fluids as well as Toradol  for pain management.  Upon reassessment, patient reported his pain had significantly improved.  I did recommend patient take Tylenol  as needed at home for pain management.  I have sent him home with a few doses of Percocet as needed for breakthrough pain.  I did recommend patient also take stool softener while taking opioid medication.  He did verbalize understanding to this.  I also provided him with a referral to general surgery for possible cholecystectomy.  Patient's vital signs are stable.  Patient is appropriate for discharge at this time.  Dispostion:  After consideration of the diagnostic results and the patients response to treatment, I feel that the patient would benefit from supportive care and general surgery follow-up.   Final diagnoses:  Symptomatic cholelithiasis    ED Discharge Orders          Ordered    ondansetron  (ZOFRAN -ODT) 4 MG disintegrating tablet  Every 8 hours PRN        06/10/24 1941    oxyCODONE -acetaminophen  (PERCOCET/ROXICET) 5-325 MG tablet  Every 6 hours PRN         06/10/24 1941               Torrence Marry RAMAN, PA-C 06/10/24 1944    Jerrol Agent, MD 06/10/24 2321  "

## 2024-06-10 NOTE — Discharge Instructions (Addendum)
 It was a pleasure taking care of you today. You were seen in the Emergency Department for evaluation of right-sided abdominal pain. Your work-up was reassuring. Your ultrasound and labs showed no evidence of acute infection of your gallbladder.  However, I do believe that your symptoms are likely secondary to symptomatic cholelithiasis.  As we discussed, I recommend you follow-up with general surgery to schedule an outpatient removal of your gallbladder.  I have provided their contact information on your discharge paperwork.  You will need to call them to schedule an appointment.  Additionally, I am sending you home with Zofran , which is an antinausea medicine as well as Percocet as needed for breakthrough pain.  I recommend taking Tylenol  every 6 hours as needed for pain management and only using the Percocet as needed. Refer to the attached documentation for further management of your symptoms.   Please return to the ER if you experience chest pain, trouble breathing, intractable nausea/vomiting or any other life threatening illnesses.

## 2024-06-10 NOTE — ED Provider Triage Note (Signed)
 Emergency Medicine Provider Triage Evaluation Note  Dennis [name] , a 34 y.o. male  was evaluated in triage.  Pt complains of abd pain, nausea. No VD. Last BM yesterday and normal. No recent abx, known sick contacts, recent travel, suspicious foods. Hx of chronic alcoholic pancreatitis  Was seen by GI on 12/30. MR abd on 06/05/24  Review of Systems  Positive: See hpi Negative:   Physical Exam  BP (!) 159/98 (BP Location: Left Arm)   Pulse 82   Temp 98.8 F (37.1 C) (Oral)   Resp 18   Wt 76.6 kg   SpO2 100%   BMI 22.91 kg/m  Gen:   Awake, no distress   Resp:  Normal effort  MSK:   Moves extremities without difficulty  Other:  TTP of RUQ, epigastric region  Medical Decision Making  Medically screening exam initiated at 3:31 PM.  Appropriate orders placed.  Ojas Coone was informed that the remainder of the evaluation will be completed by another provider, this initial triage assessment does not replace that evaluation, and the importance of remaining in the ED until their evaluation is complete.  Labs, analgesia and RUQ US  ordered   Minnie Tinnie BRAVO, PA 06/10/24 [id number]

## 2024-06-10 NOTE — ED Triage Notes (Signed)
 Patient reports abdominal pain that started last night, nausea, now radiating to RUQ, hx of chronic pancreatitis/fatty liver. Patient is alert and oriented x 4. Airway patent, respirations even and unlabored. Skin normal, warm and dry.

## 2024-06-11 ENCOUNTER — Ambulatory Visit

## 2024-06-11 ENCOUNTER — Other Ambulatory Visit (HOSPITAL_COMMUNITY): Payer: Self-pay

## 2024-06-11 ENCOUNTER — Other Ambulatory Visit: Payer: Self-pay

## 2024-06-11 ENCOUNTER — Telehealth: Payer: Self-pay | Admitting: *Deleted

## 2024-06-11 DIAGNOSIS — G40909 Epilepsy, unspecified, not intractable, without status epilepticus: Secondary | ICD-10-CM | POA: Diagnosis not present

## 2024-06-11 LAB — STOOL CULTURE: E coli, Shiga toxin Assay: NEGATIVE

## 2024-06-11 NOTE — Telephone Encounter (Signed)
 Spoke with patient., HIDA scheduled 06/18/24  6:30 AM @ [facility]  Patient instructed to remain NPO after midnight.  No narcotics within 8 hours.  Have a driver on stand-by.  EGD scheduled 2/10 @ 10:30 with Dr Wilhelmenia (pending clearance from anesthesia for recent seizure activity)  Patient voiced understanding of all appointment dates and instructions.

## 2024-06-11 NOTE — Telephone Encounter (Signed)
 Patient with seizure disorder, had one last week and one last month. He does take Keppra  as prescribed.  Last saw his neurologist 06/08/2024. Please advise if ok to proceed with EGD at [facility] with Dr Wilhelmenia on 07/10/24.  Thank you.

## 2024-06-11 NOTE — Telephone Encounter (Signed)
 First available endo I'm seeing for GM is 2/10.  Do I need to ask Dennis [name] to speak with him about something sooner? I will call now to order HIDA.

## 2024-06-11 NOTE — Progress Notes (Unsigned)
 EEG complete and ready for review.

## 2024-06-12 LAB — FECAL FAT, QUALITATIVE
Fat Qual Neutral, Stl: NORMAL
Fat Qual Total, Stl: NORMAL

## 2024-06-12 NOTE — Procedures (Signed)
 ELECTROENCEPHALOGRAM REPORT  Date of Study: 06/11/2024  Patient's Name: Dennis [name] MRN: [id number] Date of Birth: 1991-02-03  Referring Provider: Dr. Darice Shivers  Clinical History: This is a 34 year old man with recurrent seizures. EEG for classification.  CNS Active Medications: Keppra , Lexapro , Ambien   Technical Summary: A multichannel digital EEG recording measured by the international 10-20 system with electrodes applied with paste and impedances below 5000 ohms performed in our laboratory with EKG monitoring in an awake and asleep patient.  Hyperventilation and photic stimulation were performed.  The digital EEG was referentially recorded, reformatted, and digitally filtered in a variety of bipolar and referential montages for optimal display.    Description: The patient is awake and asleep during the recording.  During maximal wakefulness, there is a symmetric, medium voltage 9 Hz posterior dominant rhythm that attenuates with eye opening.  The record is symmetric.  During drowsiness and sleep, there is an increase in theta slowing of the background with vertex waves seen. Hyperventilation and photic stimulation did not elicit any abnormalities.  There were no epileptiform discharges or electrographic seizures seen.    EKG lead was unremarkable.  Impression: This awake and asleep EEG is normal.    Clinical Correlation: A normal EEG does not exclude a clinical diagnosis of epilepsy.  If further clinical questions remain, prolonged EEG may be helpful.  Clinical correlation is advised.   Darice Shivers, M.D.

## 2024-06-13 ENCOUNTER — Other Ambulatory Visit (HOSPITAL_COMMUNITY): Payer: Self-pay

## 2024-06-14 ENCOUNTER — Other Ambulatory Visit (HOSPITAL_COMMUNITY): Payer: Self-pay

## 2024-06-14 LAB — PANCREATIC ELASTASE, FECAL: Pancreatic Elastase-1, Stool: 54 ug/g — ABNORMAL LOW

## 2024-06-15 ENCOUNTER — Ambulatory Visit: Admitting: Neurology

## 2024-06-18 ENCOUNTER — Other Ambulatory Visit (HOSPITAL_COMMUNITY): Payer: Self-pay

## 2024-06-18 ENCOUNTER — Ambulatory Visit (HOSPITAL_COMMUNITY)
Admission: RE | Admit: 2024-06-18 | Discharge: 2024-06-18 | Disposition: A | Discharge status: Home / Self Care | Source: Ambulatory Visit | Attending: Physician Assistant | Admitting: Physician Assistant

## 2024-06-18 ENCOUNTER — Other Ambulatory Visit: Payer: Self-pay

## 2024-06-18 DIAGNOSIS — R1011 Right upper quadrant pain: Secondary | ICD-10-CM | POA: Diagnosis present

## 2024-06-18 DIAGNOSIS — K8020 Calculus of gallbladder without cholecystitis without obstruction: Secondary | ICD-10-CM | POA: Diagnosis present

## 2024-06-18 MED ORDER — TECHNETIUM TC 99M MEBROFENIN IV KIT
5.0000 | PACK | Freq: Once | INTRAVENOUS | Status: AC
  Administered 2024-06-18: 5.35 via INTRAVENOUS

## 2024-06-19 ENCOUNTER — Ambulatory Visit: Payer: Self-pay | Admitting: Physician Assistant

## 2024-06-19 DIAGNOSIS — R1011 Right upper quadrant pain: Secondary | ICD-10-CM

## 2024-06-20 ENCOUNTER — Other Ambulatory Visit (HOSPITAL_COMMUNITY): Payer: Self-pay

## 2024-06-20 MED ORDER — HYDROCODONE-ACETAMINOPHEN 5-325 MG PO TABS
1.0000 | ORAL_TABLET | ORAL | 0 refills | Status: DC | PRN
  Filled 2024-06-20: qty 16, 3d supply, fill #0

## 2024-06-20 MED ORDER — PREGABALIN 50 MG PO CAPS: 50.0000 mg | ORAL_CAPSULE | Freq: Three times a day (TID) | ORAL | 0 refills | Status: DC | PRN

## 2024-06-21 ENCOUNTER — Other Ambulatory Visit: Payer: Self-pay

## 2024-06-21 ENCOUNTER — Ambulatory Visit: Admitting: Nurse Practitioner

## 2024-06-21 ENCOUNTER — Encounter (HOSPITAL_COMMUNITY): Payer: Self-pay

## 2024-06-21 ENCOUNTER — Other Ambulatory Visit (HOSPITAL_COMMUNITY): Payer: Self-pay

## 2024-06-21 MED ORDER — PREGABALIN 50 MG PO CAPS: 50.0000 mg | ORAL_CAPSULE | Freq: Three times a day (TID) | ORAL | 0 refills | Status: DC | PRN

## 2024-06-22 ENCOUNTER — Other Ambulatory Visit (HOSPITAL_COMMUNITY): Payer: Self-pay

## 2024-06-22 ENCOUNTER — Encounter (HOSPITAL_COMMUNITY): Payer: Self-pay

## 2024-06-22 ENCOUNTER — Ambulatory Visit: Admitting: Nurse Practitioner

## 2024-06-22 ENCOUNTER — Encounter: Payer: Self-pay | Admitting: Neurology

## 2024-06-22 DIAGNOSIS — F411 Generalized anxiety disorder: Secondary | ICD-10-CM

## 2024-06-22 DIAGNOSIS — F5101 Primary insomnia: Secondary | ICD-10-CM

## 2024-06-22 DIAGNOSIS — E781 Pure hyperglyceridemia: Secondary | ICD-10-CM | POA: Diagnosis not present

## 2024-06-22 DIAGNOSIS — K860 Alcohol-induced chronic pancreatitis: Secondary | ICD-10-CM | POA: Diagnosis not present

## 2024-06-22 LAB — LIPID PANEL
Cholesterol: 112 mg/dL (ref 28–200)
HDL: 33 mg/dL — ABNORMAL LOW
LDL Cholesterol: 52 mg/dL (ref 10–99)
NonHDL: 79.2
Total CHOL/HDL Ratio: 3
Triglycerides: 138 mg/dL (ref 10.0–149.0)
VLDL: 27.6 mg/dL (ref 0.0–40.0)

## 2024-06-22 LAB — CBC WITH DIFFERENTIAL/PLATELET
Basophils Absolute: 0 K/uL (ref 0.0–0.1)
Basophils Relative: 0.2 % (ref 0.0–3.0)
Eosinophils Absolute: 0 K/uL (ref 0.0–0.7)
Eosinophils Relative: 0.7 % (ref 0.0–5.0)
HCT: 43.6 % (ref 39.0–52.0)
Hemoglobin: 14.7 g/dL (ref 13.0–17.0)
Lymphocytes Relative: 50.7 % — ABNORMAL HIGH (ref 12.0–46.0)
Lymphs Abs: 2.9 K/uL (ref 0.7–4.0)
MCHC: 33.7 g/dL (ref 30.0–36.0)
MCV: 92.2 fl (ref 78.0–100.0)
Monocytes Absolute: 0.4 K/uL (ref 0.1–1.0)
Monocytes Relative: 6.6 % (ref 3.0–12.0)
Neutro Abs: 2.4 K/uL (ref 1.4–7.7)
Neutrophils Relative %: 41.8 % — ABNORMAL LOW (ref 43.0–77.0)
Platelets: 178 K/uL (ref 150.0–400.0)
RBC: 4.73 Mil/uL (ref 4.22–5.81)
RDW: 14.5 % (ref 11.5–15.5)
WBC: 5.8 K/uL (ref 4.0–10.5)

## 2024-06-22 LAB — COMPREHENSIVE METABOLIC PANEL WITH GFR
ALT: 11 U/L (ref 3–53)
AST: 13 U/L (ref 5–37)
Albumin: 5.2 g/dL (ref 3.5–5.2)
Alkaline Phosphatase: 48 U/L (ref 39–117)
BUN: 13 mg/dL (ref 6–23)
CO2: 29 meq/L (ref 19–32)
Calcium: 10.1 mg/dL (ref 8.4–10.5)
Chloride: 103 meq/L (ref 96–112)
Creatinine, Ser: 1.18 mg/dL (ref 0.40–1.50)
GFR: 81 mL/min
Glucose, Bld: 94 mg/dL (ref 70–99)
Potassium: 4 meq/L (ref 3.5–5.1)
Sodium: 140 meq/L (ref 135–145)
Total Bilirubin: 1 mg/dL (ref 0.2–1.2)
Total Protein: 7.9 g/dL (ref 6.0–8.3)

## 2024-06-22 LAB — LIPASE: Lipase: 18 U/L (ref 11.0–59.0)

## 2024-06-22 LAB — HEMOGLOBIN A1C: Hgb A1c MFr Bld: 6.2 % (ref 4.6–6.5)

## 2024-06-22 MED ORDER — ZOLPIDEM TARTRATE 10 MG PO TABS
10.0000 mg | ORAL_TABLET | Freq: Every evening | ORAL | 0 refills | Status: AC | PRN
  Filled 2024-06-22: qty 30, 30d supply, fill #0
  Filled 2024-06-22: qty 15, 30d supply, fill #0

## 2024-06-22 MED ORDER — PREGABALIN 50 MG PO CAPS
50.0000 mg | ORAL_CAPSULE | Freq: Two times a day (BID) | ORAL | 0 refills | Status: DC | PRN
  Filled 2024-06-22 (×2): qty 60, 30d supply, fill #0

## 2024-06-22 NOTE — Assessment & Plan Note (Signed)
 Generalized anxiety disorder Anxiety contributing to insomnia and possibly chest pain. Currently on Lexapro  with no significant improvement yet. - Continue Lexapro . - Referred to psychiatry for further evaluation and potential gene testing for antidepressant efficacy.

## 2024-06-22 NOTE — Assessment & Plan Note (Signed)
 Hypertriglyceridemia Severe hypertriglyceridemia with historical levels reaching 6000 mg/dL. Risk factors include family history and past alcohol use. - Ordered cholesterol panel. - Referred to cardiologist for further evaluation

## 2024-06-22 NOTE — Assessment & Plan Note (Signed)
 Alcohol-induced chronic pancreatitis Chronic pancreatitis with recent flare-up. No gallstones, but gallbladder hyperkinesia present. He is waiting further evaluation with general surgery. Alcohol cessation for over two years.  - Patient is stable today with stable VS - Ordered lipase to assess pancreatic inflammation. - Advised clear liquid diet for a few days. - Refilled lyrica  prescription for pain management. He was advised on possible risks of medication as well as advised to avoid concurrent use with ambien , opioids. Told to avoid driving, continue to avoid alcohol intake while taking medication.

## 2024-06-22 NOTE — Progress Notes (Signed)
 "  Established Patient Office Visit  Subjective   Patient ID: Dennis [name], male    DOB: April 11, 1991  Age: 34 y.o. MRN: [id number]  Chief Complaint  Patient presents with   Medical Management of Chronic Issues    Follow up Patient reports pain in the right upper quadrant of the abdomen. He states the pain is accompanied by a burning sensation that radiates up to the shoulder blade. Also wanting to know if his Ambien  dosage can be increased.    Discussed the use of AI scribe software for clinical note transcription with the patient, who gave verbal consent to proceed.  History of Present Illness Dennis [name] is a 34 year old male with alcohol-induced pancreatitis who presents with abdominal pain.  Abdominal pain and pancreatitis symptoms - Abdominal pain for three days located under the right rib, radiating to the mid-back and shoulder blade - Pain described as burning and makes it difficult to get comfortable - History of severe pancreatitis flares with chronic pancreatitis; current episode perceived as milder. History of alchol abuse and high triglycerides, patient reports as high as 6000. Reports no alcohol intake for 2 years.  - Nausea and diarrhea beginning 5-10 minutes after eating and sometimes occurring without eating - No vomiting - No blood in the stool  Chest pain and dyspnea - Brief episodes of sharp chest pain lasting about five minutes - Pain worsens when lying on his back - Associated shortness of breath [facility] evaluation about a month ago (workup reassuring); chest pain possibly anxiety related - Personal history of smoking, currently vapes but does not smoke cigarettes - Family history of CAD in both mother and father, CHF in father. Onset in 50s-60s   Sleep disturbance - Insomnia for the past year - Currently sleeps four to five hours per night - Ambien  5 mg, OTC sleep aids, and melatonin ineffective - Taking two Ambien  tablets helps  with sleep onset but does not maintain sleep  Anxiety symptoms - Started Lexapro  about a month ago for anxiety - No clear benefit from Lexapro  yet      Review of Systems  Cardiovascular:  Negative for chest pain and palpitations.  Gastrointestinal:  Positive for abdominal pain, diarrhea and nausea. Negative for blood in stool and vomiting.   Estimated Creatinine Clearance: 93.3 mL/min (by C-G formula based on SCr of 1.22 mg/dL).    Objective:     There were no vitals taken for this visit.   Physical Exam Vitals reviewed.  Constitutional:      Appearance: Normal appearance.  HENT:     Head: Normocephalic and atraumatic.  Cardiovascular:     Rate and Rhythm: Normal rate and regular rhythm.  Pulmonary:     Effort: Pulmonary effort is normal.     Breath sounds: Normal breath sounds.  Abdominal:     General: Abdomen is flat. Bowel sounds are normal.     Palpations: Abdomen is soft.  Musculoskeletal:     Cervical back: Neck supple.  Skin:    General: Skin is warm and dry.  Neurological:     Mental Status: He is alert and oriented to person, place, and time.  Psychiatric:        Mood and Affect: Mood normal.        Behavior: Behavior normal.        Thought Content: Thought content normal.        Judgment: Judgment normal.      No results found for  any visits on 06/22/24.    The ASCVD Risk score (Arnett DK, et al., 2019) failed to calculate for the following reasons:   The 2019 ASCVD risk score is only valid for ages 40 to 48    Assessment & Plan:   Problem List Items Addressed This Visit       Digestive   Alcohol-induced chronic pancreatitis (HCC)   Alcohol-induced chronic pancreatitis Chronic pancreatitis with recent flare-up. No gallstones, but gallbladder hyperkinesia present. He is waiting further evaluation with general surgery. Alcohol cessation for over two years.  - Patient is stable today with stable VS - Ordered lipase to assess pancreatic  inflammation. - Advised clear liquid diet for a few days. - Refilled lyrica  prescription for pain management. He was advised on possible risks of medication as well as advised to avoid concurrent use with ambien , opioids. Told to avoid driving, continue to avoid alcohol intake while taking medication.       Relevant Medications   pregabalin  (LYRICA ) 50 MG capsule   Other Relevant Orders   Lipase   Ambulatory referral to Psychiatry   CBC with Differential/Platelet   Hemoglobin A1c     Other   Hypertriglyceridemia - Primary   Hypertriglyceridemia Severe hypertriglyceridemia with historical levels reaching 6000 mg/dL. Risk factors include family history and past alcohol use. - Ordered cholesterol panel. - Referred to cardiologist for further evaluation      Relevant Orders   Lipid panel   Lipase   Comprehensive metabolic panel with GFR   Ambulatory referral to Cardiology   CBC with Differential/Platelet   Generalized anxiety disorder   Generalized anxiety disorder Anxiety contributing to insomnia and possibly chest pain. Currently on Lexapro  with no significant improvement yet. - Continue Lexapro . - Referred to psychiatry for further evaluation and potential gene testing for antidepressant efficacy.      Primary insomnia   Primary insomnia Insomnia exacerbated by pain and anxiety. Ambien  5 mg ineffective; 10 mg considered. Risks include addiction, sleep behaviors, and sedation. - Prescribed Ambien  10 mg. - Advised against driving and alcohol consumption after taking Ambien . Advised against concurrent use with opioids and lyrica .  - Referred to psychiatry for further evaluation and potential gene testing for antidepressant efficacy.      Relevant Medications   zolpidem  (AMBIEN ) 10 MG tablet   Other Relevant Orders   Ambulatory referral to Psychiatry   Assessment and Plan Assessment & Plan Alcohol-induced chronic pancreatitis Chronic pancreatitis with recent flare-up. No  gallstones, but gallbladder hyperkinesia present. He is waiting further evaluation with general surgery. Alcohol cessation for over two years.  - Patient is stable today with stable VS - Ordered lipase to assess pancreatic inflammation. - Advised clear liquid diet for a few days. - Refilled lyrica  prescription for pain management. He was advised on possible risks of medication as well as advised to avoid concurrent use with ambien , opioids. Told to avoid driving, continue to avoid alcohol intake while taking medication.   Hypertriglyceridemia Severe hypertriglyceridemia with historical levels reaching 6000 mg/dL. Risk factors include family history and past alcohol use. - Ordered cholesterol panel. - Referred to cardiologist for further evaluation  Primary insomnia Insomnia exacerbated by pain and anxiety. Ambien  5 mg ineffective; 10 mg considered. Risks include addiction, sleep behaviors, and sedation. - Prescribed Ambien  10 mg. - Advised against driving and alcohol consumption after taking Ambien . Advised against concurrent use with opioids and lyrica .  - Referred to psychiatry for further evaluation and potential gene testing for antidepressant efficacy.  Generalized anxiety disorder Anxiety contributing to insomnia and possibly chest pain. Currently on Lexapro  with no significant improvement yet. - Continue Lexapro . - Referred to psychiatry for further evaluation and potential gene testing for antidepressant efficacy.    Return in about 1 month (around 07/23/2024) for F/U with Lauraine.    Lauraine FORBES Pereyra, NP  "

## 2024-06-22 NOTE — Assessment & Plan Note (Signed)
 Primary insomnia Insomnia exacerbated by pain and anxiety. Ambien  5 mg ineffective; 10 mg considered. Risks include addiction, sleep behaviors, and sedation. - Prescribed Ambien  10 mg. - Advised against driving and alcohol consumption after taking Ambien . Advised against concurrent use with opioids and lyrica .  - Referred to psychiatry for further evaluation and potential gene testing for antidepressant efficacy.

## 2024-06-23 ENCOUNTER — Other Ambulatory Visit: Payer: Self-pay | Admitting: Nurse Practitioner

## 2024-06-23 ENCOUNTER — Other Ambulatory Visit (HOSPITAL_COMMUNITY): Payer: Self-pay

## 2024-06-25 ENCOUNTER — Ambulatory Visit: Payer: Self-pay | Admitting: Nurse Practitioner

## 2024-06-25 ENCOUNTER — Other Ambulatory Visit (HOSPITAL_COMMUNITY): Payer: Self-pay

## 2024-06-25 ENCOUNTER — Other Ambulatory Visit: Payer: Self-pay

## 2024-06-27 ENCOUNTER — Telehealth: Payer: Self-pay

## 2024-06-27 ENCOUNTER — Other Ambulatory Visit: Payer: Self-pay

## 2024-06-27 ENCOUNTER — Other Ambulatory Visit (HOSPITAL_COMMUNITY): Payer: Self-pay

## 2024-06-27 MED ORDER — PANTOPRAZOLE SODIUM 40 MG PO TBEC
40.0000 mg | DELAYED_RELEASE_TABLET | Freq: Every day | ORAL | 3 refills | Status: AC
  Filled 2024-06-27: qty 30, 30d supply, fill #0

## 2024-06-27 NOTE — Telephone Encounter (Signed)
 MRI brain approved Auth number [id number] dates 06/22/24-08/06/24

## 2024-06-29 ENCOUNTER — Ambulatory Visit: Admitting: Nurse Practitioner

## 2024-07-03 ENCOUNTER — Other Ambulatory Visit (HOSPITAL_COMMUNITY): Payer: Self-pay

## 2024-07-03 MED ORDER — PREGABALIN 75 MG PO CAPS
75.0000 mg | ORAL_CAPSULE | Freq: Two times a day (BID) | ORAL | 3 refills | Status: AC
  Filled 2024-07-03 – 2024-07-06 (×2): qty 60, 30d supply, fill #0

## 2024-07-04 ENCOUNTER — Other Ambulatory Visit: Payer: Self-pay | Admitting: Nurse Practitioner

## 2024-07-04 ENCOUNTER — Encounter: Admitting: Gastroenterology

## 2024-07-04 ENCOUNTER — Encounter (HOSPITAL_COMMUNITY): Payer: Self-pay

## 2024-07-04 ENCOUNTER — Other Ambulatory Visit (HOSPITAL_COMMUNITY): Payer: Self-pay

## 2024-07-04 DIAGNOSIS — F5101 Primary insomnia: Secondary | ICD-10-CM

## 2024-07-04 NOTE — Telephone Encounter (Signed)
 Patient has agreed to discontinue ambien  and instead take lyrica  BID-TID to manage pain and insomnia

## 2024-07-06 ENCOUNTER — Other Ambulatory Visit (HOSPITAL_COMMUNITY): Payer: Self-pay

## 2024-07-07 ENCOUNTER — Other Ambulatory Visit

## 2024-07-10 ENCOUNTER — Encounter: Admitting: Gastroenterology

## 2024-07-12 ENCOUNTER — Ambulatory Visit: Admitting: Nurse Practitioner

## 2024-07-19 ENCOUNTER — Ambulatory Visit: Payer: Self-pay | Admitting: Neurology

## 2024-07-26 ENCOUNTER — Ambulatory Visit: Admitting: Nurse Practitioner

## 2024-08-03 ENCOUNTER — Ambulatory Visit: Admitting: Nurse Practitioner

## 2024-08-10 ENCOUNTER — Ambulatory Visit: Admitting: Nurse Practitioner

## 2024-08-27 ENCOUNTER — Ambulatory Visit: Payer: Self-pay | Admitting: Neurology
